# Patient Record
Sex: Female | Born: 1995 | Race: Black or African American | Hispanic: No | Marital: Married | Smoking: Never smoker
Health system: Southern US, Community
[De-identification: ages and names within clinical notes are randomized; demographics above are authoritative.]

## PROBLEM LIST (undated history)

## (undated) DIAGNOSIS — Z789 Other specified health status: Secondary | ICD-10-CM

## (undated) HISTORY — PX: NO PAST SURGERIES: SHX2092

---
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
## 2016-09-27 ENCOUNTER — Ambulatory Visit: Payer: Self-pay | Admitting: Obstetrics and Gynecology

## 2016-09-29 ENCOUNTER — Ambulatory Visit: Payer: Self-pay | Admitting: Obstetrics & Gynecology

## 2016-10-12 ENCOUNTER — Ambulatory Visit: Payer: Self-pay | Admitting: Obstetrics and Gynecology

## 2016-10-13 ENCOUNTER — Ambulatory Visit: Payer: Self-pay | Admitting: Obstetrics and Gynecology

## 2017-03-21 NOTE — L&D Delivery Note (Signed)
Operative Delivery Note At 11:12 PM a viable female was delivered via Vaginal, Vacuum Investment banker, operational(Extractor).  Presentation: vertex; Position: Left,, Occiput,, Anterior; Station: +3.  Verbal consent: obtained from patient.  Risks and benefits discussed in detail.  Risks include, but are not limited to the risks of anesthesia, bleeding, infection, damage to maternal tissues, fetal cephalhematoma.  There is also the risk of inability to effect vaginal delivery of the head, or shoulder dystocia that cannot be resolved by established maneuvers, leading to the need for emergency cesarean section.  Vacuum assistance offered due to maternal fatigue.  Initial presentation ROP, spontaneously rotated to LOA as vertex descended further.  Required pushing with 2 ctx with vacuum in green zone.  MLE done due to tight perineum restricting delivery.  APGAR: 7, 9; weight pending.   Placenta status: spontaneous, intact.   Cord:  with the following complications: none.    Anesthesia:  Episural and local Instruments: Kiwi cup Episiotomy: Median Lacerations: small periurethral Suture Repair: 3.0 vicryl rapide Est. Blood Loss (mL): 105  Mom to postpartum.  Baby to Couplet care / Skin to Skin.  Will do circumcision in the office.  Leighton Roachodd D Julaine Zimny 03/10/2018, 11:38 PM

## 2017-05-25 ENCOUNTER — Ambulatory Visit (INDEPENDENT_AMBULATORY_CARE_PROVIDER_SITE_OTHER): Payer: Self-pay | Admitting: Obstetrics and Gynecology

## 2017-05-25 ENCOUNTER — Encounter: Payer: Self-pay | Admitting: Obstetrics and Gynecology

## 2017-05-25 VITALS — BP 130/80 | HR 100 | Ht 66.0 in | Wt 163.0 lb

## 2017-05-25 DIAGNOSIS — Z3046 Encounter for surveillance of implantable subdermal contraceptive: Secondary | ICD-10-CM

## 2017-05-25 NOTE — Patient Instructions (Addendum)
I value your feedback and entrusting us with your care. If you get a [facility] patient survey, I would appreciate you taking the time to let us know about your experience today. Thank you!  Remove the dressing in 12-24 hours,  keep the incision area dry for 24 hours and remove the Steristrip in 2-3  days.  Notify us if any signs of tenderness, redness, pain, or fevers develop. 

## 2017-05-25 NOTE — Progress Notes (Signed)
   Chief Complaint  Patient presents with  . Contraception     History of Present Illness:  Betty Richardson is a 22 y.o. that had a nexplanon placed approximately 3 1/2 yrs ago. It has expired and is due for removal.   Nexplanon removal Procedure note - The Nexplanon was noted in the patient's arm and the end was identified. The skin was cleansed with a Betadine solution. A small injection of subcutaneous lidocaine with epinephrine was given over the end of the implant. An incision was made at the end of the implant. The rod was noted in the incision and grasped with a hemostat. It was noted to be intact.  Steri-Strip was placed approximating the incision. Hemostasis was noted.  Assessment: Nexplanon removal   Plan:   She was told to remove the dressing in 12-24 hours, to keep the incision area dry for 24 hours and to remove the Steristrip in 2-3  days.  Notify us if any signs of tenderness, redness, pain, or fevers develop.   Suman Trivedi B. Deniqua Perry, PA-C 05/25/2017 9:05 AM

## 2017-08-13 ENCOUNTER — Ambulatory Visit
Admission: EM | Admit: 2017-08-13 | Discharge: 2017-08-13 | Disposition: A | Discharge status: Home / Self Care | Payer: BLUE CROSS/BLUE SHIELD | Attending: Family Medicine | Admitting: Family Medicine

## 2017-08-13 ENCOUNTER — Other Ambulatory Visit: Payer: Self-pay

## 2017-08-13 DIAGNOSIS — J029 Acute pharyngitis, unspecified: Secondary | ICD-10-CM | POA: Diagnosis not present

## 2017-08-13 LAB — RAPID STREP SCREEN (MED CTR MEBANE ONLY): Streptococcus, Group A Screen (Direct): NEGATIVE

## 2017-08-13 MED ORDER — AMOXICILLIN 500 MG PO CAPS: 500.0000 mg | ORAL_CAPSULE | Freq: Two times a day (BID) | ORAL | 0 refills | Status: AC

## 2017-08-13 NOTE — ED Provider Notes (Signed)
[facility] ____________________________________________  Time seen: Approximately 1:59 PM  I have reviewed the triage vital signs and the nursing notes.   HISTORY  Chief Complaint Sore Throat   HPI Betty Richardson is a 22 y.o. female presenting for evaluation of sore throat for the last 2 to 3 days.  States sore throat is painful to swallow, and constant.  States sore throat is currently moderate.  States has had decreased p.o. intake due to sore throat and pain with swallowing, but does continue to drink fluids, has not eaten much in the last 2 days.  No vomiting or diarrhea.  Denies abdominal pain, vaginal discomfort, vaginal discharge, leaking or vaginal bleeding.  Patient denies cough or nasal drainage.  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  States when she is trying to clear the nasal congestion from back of her throat she does sometimes have blood present in the mucus, but states that this is only when clearing the mucus.  Denies any other atypical bleeding.  Denies coughing up blood.  Denies known fevers.  Has not been taken over-the-counter medications for the same complaints.  Denies known close sick contacts.  Reports otherwise feels well.  States she is approximately 2.5 months pregnant.  States has a follow-up with OB/GYN this coming Thursday for evaluation Denies chest pain, shortness of breath, abdominal pain, dysuria, or rash. Denies recent sickness. Denies recent antibiotic use.    History reviewed. No pertinent past medical history.  There are no active problems to display for this patient.   Past Surgical History:  Procedure Laterality Date  . NO PAST SURGERIES       No current facility-administered medications for this encounter.   Current Outpatient Medications:  .  Prenatal Vit-Fe Fumarate-FA (PRENATAL MULTIVITAMIN) TABS tablet,  Take 1 tablet by mouth daily at 12 noon., Disp: , Rfl:  .  amoxicillin (AMOXIL) 500 MG capsule, Take 1 capsule (500 mg total) by mouth 2 (two) times daily for 10 days., Disp: 20 capsule, Rfl: 0  Allergies Patient has no known allergies.  Family History  Problem Relation Age of Onset  . Heart failure Maternal Grandmother 70       type 2  . Heart failure Maternal Grandfather 60       type2  . Seizures Father     Social History Social History   Tobacco Use  . Smoking status: Never Smoker  . Smokeless tobacco: Never Used  Substance Use Topics  . Alcohol use: No  . Drug use: No    Review of Systems Constitutional: No fever ENT: positive sore throat. Cardiovascular: Denies chest pain. Respiratory: Denies shortness of breath. Gastrointestinal: No abdominal pain.  No nausea, no vomiting.  No diarrhea.   Genitourinary: Negative for dysuria. Musculoskeletal: Negative for back pain. Skin: Negative for rash.   ____________________________________________   PHYSICAL EXAM:  VITAL SIGNS: ED Triage Vitals  Enc Vitals Group     BP 08/13/17 1348 118/77     Pulse Rate 08/13/17 1348 86     Resp 08/13/17 1348 17     Temp 08/13/17 1348 98.6 F (37 C)     Temp Source 08/13/17 1348 Oral     SpO2 08/13/17 1348 100 %     Weight --      Height 08/13/17 1343  (1.702 m)     Head Circumference --  Peak Flow --      Pain Score 08/13/17 1343 10     Pain Loc --      Pain Edu? --      Excl. in GC? --    Constitutional: Alert and oriented. Well appearing and in no acute distress. Eyes: Conjunctivae are normal. Head: Atraumatic. No sinus tenderness to palpation. No swelling. No erythema.  Ears: no erythema, normal TMs bilaterally.   Nose:No nasal congestion.   Mouth/Throat: Mucous membranes are moist. Moderate pharyngeal erythema. Mild bilateral tonsillar swelling and scant bilateral exudate. Neck: No stridor.  No cervical spine tenderness to  palpation. Hematological/Lymphatic/Immunilogical: Mild anterior bilateral cervical lymphadenopathy. Cardiovascular: Normal rate, regular rhythm. Grossly normal heart sounds.  Good peripheral circulation. Respiratory: Normal respiratory effort.  No retractions. No wheezes, rales or rhonchi. Good air movement.  Gastrointestinal: Soft and nontender.  Musculoskeletal: Ambulatory with steady gait.  Neurologic:  Normal speech and language. No gait instability. Skin:  Skin appears warm, dry and intact. No rash noted. Psychiatric: Mood and affect are normal. Speech and behavior are normal.  ___________________________________________   LABS (all labs ordered are listed, but only abnormal results are displayed)  Labs Reviewed  RAPID STREP SCREEN ([facility] ONLY)  CULTURE, GROUP A STREP [facility])   PROCEDURES Procedures   INITIAL IMPRESSION / ASSESSMENT AND PLAN / ED COURSE  Pertinent labs & imaging results that were available during my care of the patient were reviewed by me and considered in my medical decision making (see chart for details).  Well-appearing patient.  No acute distress.  Presenting for evaluation of a sore throat.  Quick strep negative, will culture.  However suspicious for streptococcal pharyngitis.  Will empirically start on oral amoxicillin.  Over-the-counter Tylenol as needed,  Gargles and supportive care.discussed closely monitoring symptoms and sooner reevaluation for no improvement or worsening complaints.  Also discussed avoidance of trigger.Discussed indication, risks and benefits of medications with patient.  Discussed follow up with Primary care physician this week. Discussed follow up and return parameters including no resolution or any worsening concerns. Patient verbalized understanding and agreed to plan.   ____________________________________________   FINAL CLINICAL IMPRESSION(S) / ED DIAGNOSES  Final diagnoses:  Pharyngitis, unspecified etiology     ED  Discharge Orders        Ordered    amoxicillin (AMOXIL) 500 MG capsule  2 times daily     08/13/17 1417       Note: This dictation was prepared with Dragon dictation along with smaller phrase technology. Any transcriptional errors that result from this process are unintentional.         Renford Dills, NP 08/13/17 1443

## 2017-08-13 NOTE — ED Triage Notes (Signed)
Patient complains of sore throat that started a few days ago. Patient states that she is currently pregnant.

## 2017-08-13 NOTE — Discharge Instructions (Addendum)
Take medication as prescribed. Rest. Drink plenty of fluids.  ° °Follow up with your primary care physician this week as needed. Return to Urgent care for new or worsening concerns.  ° °

## 2017-08-15 ENCOUNTER — Telehealth (HOSPITAL_COMMUNITY): Payer: Self-pay

## 2017-08-15 LAB — CULTURE, GROUP A STREP (THRC)

## 2017-08-15 NOTE — Telephone Encounter (Signed)
Strep is positive, pt was treated with amoxicillin at urgent care visit. Pt called and made aware. Answered all questions.

## 2017-09-06 LAB — OB RESULTS CONSOLE RUBELLA ANTIBODY, IGM: Rubella: IMMUNE

## 2017-09-06 LAB — OB RESULTS CONSOLE HEPATITIS B SURFACE ANTIGEN: Hepatitis B Surface Ag: NEGATIVE

## 2017-12-13 LAB — OB RESULTS CONSOLE HIV ANTIBODY (ROUTINE TESTING): HIV: NONREACTIVE

## 2018-02-12 LAB — OB RESULTS CONSOLE GC/CHLAMYDIA
Chlamydia: NEGATIVE
Gonorrhea: NEGATIVE

## 2018-02-12 LAB — OB RESULTS CONSOLE GBS: GBS: NEGATIVE

## 2018-03-10 ENCOUNTER — Encounter (HOSPITAL_COMMUNITY): Payer: Self-pay

## 2018-03-10 ENCOUNTER — Inpatient Hospital Stay (HOSPITAL_COMMUNITY)
Admission: RE | Admit: 2018-03-10 | Discharge: 2018-03-12 | DRG: 807 | Disposition: A | Discharge status: Home / Self Care | Payer: Medicaid Other | Attending: Obstetrics and Gynecology | Admitting: Obstetrics and Gynecology

## 2018-03-10 ENCOUNTER — Inpatient Hospital Stay (HOSPITAL_COMMUNITY): Payer: Medicaid Other | Admitting: Anesthesiology

## 2018-03-10 DIAGNOSIS — Z3483 Encounter for supervision of other normal pregnancy, third trimester: Secondary | ICD-10-CM | POA: Diagnosis present

## 2018-03-10 DIAGNOSIS — Z3A40 40 weeks gestation of pregnancy: Secondary | ICD-10-CM | POA: Diagnosis not present

## 2018-03-10 DIAGNOSIS — O480 Post-term pregnancy: Secondary | ICD-10-CM | POA: Diagnosis present

## 2018-03-10 LAB — CBC
HCT: 38.8 % (ref 36.0–46.0)
HCT: 38 % (ref 36.0–46.0)
HEMOGLOBIN: 12.9 g/dL (ref 12.0–15.0)
Hemoglobin: 13.4 g/dL (ref 12.0–15.0)
MCH: 28.8 pg (ref 26.0–34.0)
MCH: 28.9 pg (ref 26.0–34.0)
MCHC: 34.5 g/dL (ref 30.0–36.0)
MCHC: 33.9 g/dL (ref 30.0–36.0)
MCV: 83.4 fL (ref 80.0–100.0)
MCV: 85 fL (ref 80.0–100.0)
Platelets: 116 10*3/uL — ABNORMAL LOW (ref 150–400)
Platelets: 102 10*3/uL — ABNORMAL LOW (ref 150–400)
RBC: 4.65 MIL/uL (ref 3.87–5.11)
RBC: 4.47 MIL/uL (ref 3.87–5.11)
RDW: 13.5 % (ref 11.5–15.5)
RDW: 13.7 % (ref 11.5–15.5)
WBC: 9.5 10*3/uL (ref 4.0–10.5)
WBC: 11.4 10*3/uL — ABNORMAL HIGH (ref 4.0–10.5)
nRBC: 0 % (ref 0.0–0.2)
nRBC: 0 % (ref 0.0–0.2)

## 2018-03-10 LAB — TYPE AND SCREEN
ABO/RH(D): A POS
Antibody Screen: NEGATIVE

## 2018-03-10 LAB — ABO/RH: ABO/RH(D): A POS

## 2018-03-10 LAB — RPR: RPR Ser Ql: NONREACTIVE

## 2018-03-10 MED ORDER — ONDANSETRON HCL 4 MG/2ML IJ SOLN: 4.0000 mg | Freq: Four times a day (QID) | INTRAMUSCULAR | Status: DC | PRN

## 2018-03-10 MED ORDER — OXYCODONE-ACETAMINOPHEN 5-325 MG PO TABS: 2.0000 | ORAL_TABLET | ORAL | Status: DC | PRN

## 2018-03-10 MED ORDER — BUTORPHANOL TARTRATE 1 MG/ML IJ SOLN
1.0000 mg | INTRAMUSCULAR | Status: DC | PRN
  Administered 2018-03-10 (×2): 1 mg via INTRAVENOUS
  Filled 2018-03-10 (×2): qty 1

## 2018-03-10 MED ORDER — LACTATED RINGERS IV SOLN
500.0000 mL | Freq: Once | INTRAVENOUS | Status: AC
  Administered 2018-03-10: 500 mL via INTRAVENOUS

## 2018-03-10 MED ORDER — LIDOCAINE HCL URETHRAL/MUCOSAL 2 % EX GEL
1.0000 "application " | CUTANEOUS | Status: DC | PRN
  Administered 2018-03-10: 1 via URETHRAL
  Filled 2018-03-10 (×2): qty 5

## 2018-03-10 MED ORDER — OXYTOCIN 40 UNITS IN LACTATED RINGERS INFUSION - SIMPLE MED
1.0000 m[IU]/min | INTRAVENOUS | Status: DC
  Administered 2018-03-10: 2 m[IU]/min via INTRAVENOUS
  Filled 2018-03-10: qty 1000

## 2018-03-10 MED ORDER — ACETAMINOPHEN 325 MG PO TABS: 650.0000 mg | ORAL_TABLET | ORAL | Status: DC | PRN

## 2018-03-10 MED ORDER — LACTATED RINGERS IV SOLN
INTRAVENOUS | Status: DC
  Administered 2018-03-10 (×2): via INTRAVENOUS

## 2018-03-10 MED ORDER — OXYCODONE-ACETAMINOPHEN 5-325 MG PO TABS: 1.0000 | ORAL_TABLET | ORAL | Status: DC | PRN

## 2018-03-10 MED ORDER — LIDOCAINE HCL (PF) 1 % IJ SOLN
30.0000 mL | INTRAMUSCULAR | Status: DC | PRN
  Administered 2018-03-10: 30 mL via SUBCUTANEOUS
  Filled 2018-03-10: qty 30

## 2018-03-10 MED ORDER — FENTANYL 2.5 MCG/ML BUPIVACAINE 1/10 % EPIDURAL INFUSION (WH - ANES)
14.0000 mL/h | INTRAMUSCULAR | Status: DC | PRN
  Administered 2018-03-10: 14 mL/h via EPIDURAL
  Filled 2018-03-10: qty 100

## 2018-03-10 MED ORDER — OXYTOCIN 40 UNITS IN LACTATED RINGERS INFUSION - SIMPLE MED: 2.5000 [IU]/h | INTRAVENOUS | Status: DC

## 2018-03-10 MED ORDER — LIDOCAINE HCL (PF) 1 % IJ SOLN
INTRAMUSCULAR | Status: DC | PRN
  Administered 2018-03-10 (×2): 5 mL via EPIDURAL

## 2018-03-10 MED ORDER — SOD CITRATE-CITRIC ACID 500-334 MG/5ML PO SOLN: 30.0000 mL | ORAL | Status: DC | PRN

## 2018-03-10 MED ORDER — PHENYLEPHRINE 40 MCG/ML (10ML) SYRINGE FOR IV PUSH (FOR BLOOD PRESSURE SUPPORT)
80.0000 ug | PREFILLED_SYRINGE | INTRAVENOUS | Status: DC | PRN
  Administered 2018-03-10: 80 ug via INTRAVENOUS
  Filled 2018-03-10: qty 10

## 2018-03-10 MED ORDER — EPHEDRINE 5 MG/ML INJ
10.0000 mg | INTRAVENOUS | Status: DC | PRN
  Filled 2018-03-10: qty 2

## 2018-03-10 MED ORDER — PHENYLEPHRINE 40 MCG/ML (10ML) SYRINGE FOR IV PUSH (FOR BLOOD PRESSURE SUPPORT)
80.0000 ug | PREFILLED_SYRINGE | INTRAVENOUS | Status: DC | PRN
  Filled 2018-03-10 (×2): qty 10

## 2018-03-10 MED ORDER — DIPHENHYDRAMINE HCL 50 MG/ML IJ SOLN: 12.5000 mg | INTRAMUSCULAR | Status: DC | PRN

## 2018-03-10 MED ORDER — OXYTOCIN 10 UNIT/ML IJ SOLN
INTRAMUSCULAR | Status: AC
  Filled 2018-03-10: qty 1

## 2018-03-10 MED ORDER — TERBUTALINE SULFATE 1 MG/ML IJ SOLN
0.2500 mg | Freq: Once | INTRAMUSCULAR | Status: DC | PRN
  Filled 2018-03-10: qty 1

## 2018-03-10 MED ORDER — OXYTOCIN BOLUS FROM INFUSION
500.0000 mL | Freq: Once | INTRAVENOUS | Status: AC
  Administered 2018-03-10: 500 mL via INTRAVENOUS

## 2018-03-10 MED ORDER — LACTATED RINGERS IV SOLN: 500.0000 mL | INTRAVENOUS | Status: DC | PRN

## 2018-03-10 NOTE — Anesthesia Preprocedure Evaluation (Addendum)
Anesthesia Evaluation  Patient identified by MRN, date of birth, ID band Patient awake    Reviewed: Allergy & Precautions, NPO status , Patient's Chart, lab work & pertinent test results  History of Anesthesia Complications Negative for: history of anesthetic complications  Airway Mallampati: IV  TM Distance: >3 FB Neck ROM: Full    Dental  (+) Dental Advisory Given   Pulmonary neg pulmonary ROS,    breath sounds clear to auscultation       Cardiovascular negative cardio ROS   Rhythm:Regular Rate:Normal     Neuro/Psych negative neurological ROS     GI/Hepatic Neg liver ROS, GERD  Poorly Controlled,  Endo/Other  obese  Renal/GU negative Renal ROS     Musculoskeletal   Abdominal (+) + obese,   Peds  Hematology plt 116k, repeat 102k   Anesthesia Other Findings   Reproductive/Obstetrics (+) Pregnancy                             Anesthesia Physical Anesthesia Plan  ASA: II  Anesthesia Plan: Epidural   Post-op Pain Management:    Induction:   PONV Risk Score and Plan: 2 and Treatment may vary due to age or medical condition  Airway Management Planned: Natural Airway  Additional Equipment:   Intra-op Plan:   Post-operative Plan:   Informed Consent: I have reviewed the patients History and Physical, chart, labs and discussed the procedure including the risks, benefits and alternatives for the proposed anesthesia with the patient or authorized representative who has indicated his/her understanding and acceptance.   Dental advisory given  Plan Discussed with:   Anesthesia Plan Comments: (Patient identified. Risks/Benefits/Options discussed with patient including but not limited to bleeding, infection, nerve damage, paralysis, failed block, incomplete pain control, headache, blood pressure changes, nausea, vomiting, reactions to medication both or allergic, itching and postpartum  back pain. Confirmed with bedside nurse the patient's most recent platelet count. Confirmed with patient that they are not currently taking any anticoagulation, have any bleeding history or any family history of bleeding disorders. Patient expressed understanding and wished to proceed. All questions were answered. )        Anesthesia Quick Evaluation

## 2018-03-10 NOTE — H&P (Signed)
Jodelle GrossShakira M Boswell is a 22 y.o. female, G1P0, [facility]redacted[facility] presenting for elective induction.  Prenatal care essentially uncomplicated.  OB History    Gravida  1   Para  0   Term  0   Preterm  0   AB  0   Living  0     SAB  0   TAB  0   Ectopic  0   Multiple  0   Live Births  0          History reviewed. No pertinent past medical history. Past Surgical History:  Procedure Laterality Date  . NO PAST SURGERIES     Family History: family history includes Heart failure (age of onset: 3960) in her maternal grandfather; Heart failure (age of onset: 8070) in her maternal grandmother; Seizures in her father. Social History:  reports that she has never smoked. She has never used smokeless tobacco. She reports that she does not drink alcohol or use drugs.     Maternal Diabetes: No Genetic Screening: Declined Maternal Ultrasounds/Referrals: Normal Fetal Ultrasounds or other Referrals:  None Maternal Substance Abuse:  No Significant Maternal Medications:  None Significant Maternal Lab Results:  Lab values include: Group B Strep negative Other Comments:  None  Review of Systems  Respiratory: Negative.   Cardiovascular: Negative.    Maternal Medical History:  Fetal activity: Perceived fetal activity is normal.    Prenatal complications: no prenatal complications Prenatal Complications - Diabetes: none.    Dilation: 1.5 Effacement (%): 50 Station: -2 Exam by:: Sandy SalaamLynnsey Johnson, RN Blood pressure 129/73, pulse 97, temperature 98.2 F (36.8 C), temperature source Oral, resp. rate 18, height 5\' 7"  (1.702 m), weight 108.2 kg, last menstrual period 05/25/2017. Maternal Exam:  Uterine Assessment: Contraction strength is mild.  Contraction frequency is rare.   Abdomen: Patient reports no abdominal tenderness. Estimated fetal weight is 7 1/2 lbs.   Fetal presentation: vertex  Introitus: Normal vagina.  Amniotic fluid character: not assessed.  Pelvis:  adequate for delivery.   Cervix: Cervix evaluated by digital exam.     Fetal Exam Fetal Monitor Review: Mode: ultrasound.   Baseline rate: 130.  Variability: moderate (6-25 bpm).   Pattern: accelerations present and no decelerations.    Fetal State Assessment: Category I - tracings are normal.     Physical Exam  Vitals reviewed. Constitutional: She appears well-developed and well-nourished.  Cardiovascular: Normal rate and regular rhythm.  Respiratory: Effort normal. No respiratory distress.  GI: Soft.    Prenatal labs: ABO, Rh: --/--/A POS (12/21 0757) Antibody: NEG (12/21 0757) Rubella:  Immune RPR:   NR HBsAg:  Neg  HIV:   NR GBS:   Neg  Assessment/Plan: IUP at 40+ weeks for elective induction.  Will start pitocin, monitor progress, anticipate SVD.   Leighton Roachodd D Shameka Aggarwal 03/10/2018, 9:43 AM

## 2018-03-10 NOTE — Progress Notes (Signed)
Starting to feel ctx Afeb, VSS, BP mostly normal, a bit labile FHT- 130s, Cat I VE-2-3/70/-1 to -2, vtx, AROM clear Continue pitocin, monitor progress

## 2018-03-10 NOTE — Anesthesia Pain Management Evaluation Note (Signed)
  [facility] Pain Management Visit Note  Patient: Jodelle GrossShakira M Boswell, 22 y.o., female  "Hello [facility]. We have an anesthesia team available at all times to provide care throughout the hospital, including epidural management and anesthesia for C-section. I don't know your plan for the delivery whether it a natural birth, water birth, IV sedation, nitrous supplementation, doula or epidural, but we want to meet your pain goals."   1.Was your pain managed to your expectations on prior hospitalizations?   No prior hospitalizations  2.What is your expectation for pain management during this hospitalization?     Epidural  3.How can we help you reach that goal?   Record the patient's initial score and the patient's pain goal.   Pain: 0  Pain Goal: 7 [facility] wants you to be able to say your pain was always managed very well.  Laban EmperorMalinova,Loris Seelye Hristova 03/10/2018

## 2018-03-10 NOTE — Anesthesia Procedure Notes (Addendum)
Epidural Patient location during procedure: OB Start time: 03/10/2018 4:21 PM End time: 03/10/2018 4:43 PM  Staffing Anesthesiologist: Jairo BenJackson, Trey Gulbranson, MD Performed: anesthesiologist   Preanesthetic Checklist Completed: patient identified, surgical consent, pre-op evaluation, timeout performed, IV checked, risks and benefits discussed and monitors and equipment checked  Epidural Patient position: sitting Prep: site prepped and draped and DuraPrep Patient monitoring: blood pressure, continuous pulse ox and heart rate Approach: midline Location: L2-L3 Injection technique: LOR air  Needle:  Needle type: Tuohy  Needle gauge: 17 G Needle length: 9 cm Needle insertion depth: 6 cm Catheter type: closed end flexible Catheter size: 19 Gauge Catheter at skin depth: 11 cm Test dose: negative (1% lidocaine)  Assessment Events: blood not aspirated, injection not painful, no injection resistance, negative IV test and no paresthesia  Additional Notes Pt identified in Labor room.  Monitors applied. Working IV access confirmed. Sterile prep, drape lumbar spine.  1% lido local L 2,3.  #17ga Touhy LOR air at 6 cm L 2,3, cath in easily to 11 cm skin. Test dose OK, cath dosed and infusion begun.  Patient asymptomatic, VSS, no heme aspirated, tolerated well.  Sandford Craze Yeiren Whitecotton, MDReason for block:procedure for pain

## 2018-03-11 LAB — CBC
HCT: 36.6 % (ref 36.0–46.0)
Hemoglobin: 12.5 g/dL (ref 12.0–15.0)
MCH: 28.8 pg (ref 26.0–34.0)
MCHC: 34.2 g/dL (ref 30.0–36.0)
MCV: 84.3 fL (ref 80.0–100.0)
NRBC: 0 % (ref 0.0–0.2)
Platelets: 127 10*3/uL — ABNORMAL LOW (ref 150–400)
RBC: 4.34 MIL/uL (ref 3.87–5.11)
RDW: 13.6 % (ref 11.5–15.5)
WBC: 11.1 10*3/uL — ABNORMAL HIGH (ref 4.0–10.5)

## 2018-03-11 MED ORDER — IBUPROFEN 600 MG PO TABS
600.0000 mg | ORAL_TABLET | Freq: Four times a day (QID) | ORAL | Status: DC
  Administered 2018-03-11 – 2018-03-12 (×4): 600 mg via ORAL
  Filled 2018-03-11 (×4): qty 1

## 2018-03-11 MED ORDER — ZOLPIDEM TARTRATE 5 MG PO TABS: 5.0000 mg | ORAL_TABLET | Freq: Every evening | ORAL | Status: DC | PRN

## 2018-03-11 MED ORDER — SENNOSIDES-DOCUSATE SODIUM 8.6-50 MG PO TABS
2.0000 | ORAL_TABLET | ORAL | Status: DC
  Administered 2018-03-11 – 2018-03-12 (×2): 2 via ORAL
  Filled 2018-03-11 (×2): qty 2

## 2018-03-11 MED ORDER — DIBUCAINE 1 % RE OINT: 1.0000 "application " | TOPICAL_OINTMENT | RECTAL | Status: DC | PRN

## 2018-03-11 MED ORDER — METHYLERGONOVINE MALEATE 0.2 MG/ML IJ SOLN: 0.2000 mg | INTRAMUSCULAR | Status: DC | PRN

## 2018-03-11 MED ORDER — OXYCODONE HCL 5 MG PO TABS: 10.0000 mg | ORAL_TABLET | ORAL | Status: DC | PRN

## 2018-03-11 MED ORDER — ACETAMINOPHEN 325 MG PO TABS
650.0000 mg | ORAL_TABLET | ORAL | Status: DC | PRN
  Administered 2018-03-11: 650 mg via ORAL
  Filled 2018-03-11: qty 2

## 2018-03-11 MED ORDER — DIPHENHYDRAMINE HCL 25 MG PO CAPS: 25.0000 mg | ORAL_CAPSULE | Freq: Four times a day (QID) | ORAL | Status: DC | PRN

## 2018-03-11 MED ORDER — SIMETHICONE 80 MG PO CHEW: 80.0000 mg | CHEWABLE_TABLET | ORAL | Status: DC | PRN

## 2018-03-11 MED ORDER — METHYLERGONOVINE MALEATE 0.2 MG PO TABS: 0.2000 mg | ORAL_TABLET | ORAL | Status: DC | PRN

## 2018-03-11 MED ORDER — MAGNESIUM HYDROXIDE 400 MG/5ML PO SUSP: 30.0000 mL | ORAL | Status: DC | PRN

## 2018-03-11 MED ORDER — PRENATAL MULTIVITAMIN CH
1.0000 | ORAL_TABLET | Freq: Every day | ORAL | Status: DC
  Administered 2018-03-11: 1 via ORAL

## 2018-03-11 MED ORDER — TETANUS-DIPHTH-ACELL PERTUSSIS 5-2.5-18.5 LF-MCG/0.5 IM SUSP: 0.5000 mL | Freq: Once | INTRAMUSCULAR | Status: DC

## 2018-03-11 MED ORDER — COCONUT OIL OIL: 1.0000 "application " | TOPICAL_OIL | Status: DC | PRN

## 2018-03-11 MED ORDER — ONDANSETRON HCL 4 MG PO TABS: 4.0000 mg | ORAL_TABLET | ORAL | Status: DC | PRN

## 2018-03-11 MED ORDER — BENZOCAINE-MENTHOL 20-0.5 % EX AERO: 1.0000 "application " | INHALATION_SPRAY | CUTANEOUS | Status: DC | PRN

## 2018-03-11 MED ORDER — OXYCODONE HCL 5 MG PO TABS: 5.0000 mg | ORAL_TABLET | ORAL | Status: DC | PRN

## 2018-03-11 MED ORDER — WITCH HAZEL-GLYCERIN EX PADS: 1.0000 "application " | MEDICATED_PAD | CUTANEOUS | Status: DC | PRN

## 2018-03-11 MED ORDER — MEASLES, MUMPS & RUBELLA VAC IJ SOLR: 0.5000 mL | Freq: Once | INTRAMUSCULAR | Status: DC

## 2018-03-11 MED ORDER — ONDANSETRON HCL 4 MG/2ML IJ SOLN: 4.0000 mg | INTRAMUSCULAR | Status: DC | PRN

## 2018-03-11 NOTE — Anesthesia Postprocedure Evaluation (Signed)
Anesthesia Post Note  Patient: Betty Richardson  Procedure(s) Performed: AN AD HOC LABOR EPIDURAL     Patient location during evaluation: Mother Baby Anesthesia Type: Epidural Level of consciousness: awake and alert Pain management: pain level controlled Vital Signs Assessment: post-procedure vital signs reviewed and stable Respiratory status: spontaneous breathing, nonlabored ventilation and respiratory function stable Cardiovascular status: stable Postop Assessment: no headache, no backache, epidural receding, no apparent nausea or vomiting, patient able to bend at knees, able to ambulate and adequate PO intake Anesthetic complications: no    Last Vitals:  Vitals:   03/11/18 0212 03/11/18 0636  BP: (!) 126/58 129/72  Pulse: (!) 114 (!) 102  Resp: 18 18  Temp: 36.4 C 37.2 C  SpO2:      Last Pain:  Vitals:   03/11/18 0745  TempSrc:   PainSc: (P) 2    Pain Goal:                 Betty Richardson

## 2018-03-11 NOTE — Progress Notes (Signed)
PPD #1 No problems Afeb, VSS Fundus firm, NT at U-1 Continue routine postpartum care 

## 2018-03-11 NOTE — Lactation Note (Signed)
This note was copied from a baby's chart. Lactation Consultation Note  Patient Name: Betty Abigail MiyamotoShakira Richardson Betty Richardson Reason for consult: Initial assessment;1st time breastfeeding;Term P1, 8 hour female infant. BF concerns: infant is spitty with emesis and reluctant to latch at this time, mom has short shaft nipples with large breast.  Per mom, infant did not latch in L&D and she made few attempts. LC assisted mom in latching infant to breast but infant was reluctant to latch at this time and was spitty with emesis. Mom hand expressed 13 ml of colostrum and infant took 5 ml of colostrum by spoon. Mom has short shaft nipples and was fitted with 20 mm NS the 24 NS was to large and infant attempted to suckle before becoming spitty. LC discussed I & O. Mom shown how to use DEBP & how to disassemble, clean, & reassemble parts. Mom made aware of O/P services, breastfeeding support groups, community resources, and our phone # for post-discharge questions.  BF plans: 1. Mom will Bf according hunger cues and not exceed 3 hours without BF infant. 2. Mom will wear breast shells in bra during the day and will not sleep in shells at night. 3. Mom will attempt to latch infant to breast with 20 mm NS. 4. Mom will give back infant any pumped EBM.  5. Mom will ask for assistance  with latching infant to breast from Nurse or LC at next feeding.  Maternal Data Formula Feeding for Exclusion: Yes Reason for exclusion: Mother's choice to formula and breast feed on admission Has patient been taught Hand Expression?: Yes(Mom hand expressed 13 ml of colostrum, infant was spoon feed 5 ml of colostrum.) Does the patient have breastfeeding experience prior to this delivery?: No  Feeding Feeding Type: Breast Fed  LATCH Score Latch: Repeated attempts needed to sustain latch, nipple held in mouth throughout feeding, stimulation needed to elicit sucking reflex.  Audible Swallowing: None  Type of Nipple:  Everted at rest and after stimulation(short shafted and semi -flat )  Comfort (Breast/Nipple): Soft / non-tender  Hold (Positioning): Assistance needed to correctly position infant at breast and maintain latch.  LATCH Score: 6  Interventions Interventions: Breast feeding basics reviewed;Assisted with latch;Skin to skin;Breast compression;Breast massage;Adjust position;Shells;Hand express;Support pillows;Position options;DEBP  Lactation Tools Discussed/Used Tools: Shells;Pump;Nipple Shields Nipple shield size: 20;24 Shell Type: (flat and short shafted ) WIC Program: Yes Pump Review: Setup, frequency, and cleaning;Milk Storage;Other (comment) Initiated by:: Betty Richardson, Betty Richardson Date initiated:: 03/11/18   Consult Status Consult Status: Follow-up Date: 03/11/18 Follow-up type: In-patient    Betty Richardson Richardson, 7:36 AM

## 2018-03-12 ENCOUNTER — Encounter (HOSPITAL_COMMUNITY): Payer: Self-pay | Admitting: *Deleted

## 2018-03-12 MED ORDER — IBUPROFEN 600 MG PO TABS: 600.0000 mg | ORAL_TABLET | Freq: Four times a day (QID) | ORAL | 0 refills | Status: DC

## 2018-03-12 MED ORDER — ACETAMINOPHEN 325 MG PO TABS: 650.0000 mg | ORAL_TABLET | ORAL | 0 refills | Status: DC | PRN

## 2018-03-12 NOTE — Progress Notes (Signed)
Post Partum Day 2 Subjective: no complaints, up ad lib and tolerating PO  Objective: Blood pressure 105/63, pulse 82, temperature 98.1 F (36.7 C), temperature source Oral, resp. rate 16, height 5\' 7"  (1.702 m), weight 108.2 kg, last menstrual period 05/25/2017, SpO2 100 %.  Physical Exam:  General: alert and cooperative Lochia: appropriate Uterine Fundus: firm   Recent Labs    03/10/18 1604 03/11/18 0002  HGB 12.9 12.5  HCT 38.0 36.6    Assessment/Plan: Discharge home   LOS: 2 days   Betty Richardson 03/12/2018, 9:35 AM

## 2018-03-12 NOTE — Lactation Note (Signed)
This note was copied from a baby's chart. Lactation Consultation Note  Patient Name: Boy Abigail MiyamotoShakira Boswell ZOXWR'UToday's Date: 03/12/2018 Reason for consult: Follow-up assessment;1st time breastfeeding;Primapara;Term;Infant weight loss(5% weight loss )  Baby is 40 hours old  LC reviewed and updated the doc flow sheets per mom and dad. LC confirmed with mom and dad the last wet was at 12/22 at 1305.  LC discussed the indicator on the diaper, and the location on the diaper  Where boys wet. Mom and dad felt they probably missed wet diapers.  L C reviewed breast feeding basics with consult.  Baby showing feeding cues after Dr. Ezequiel EssexGable checked the baby.  Mom mentioned she has been doing the cradle position.  LC recommended to ensure the baby learns depth with the latch and until the  Baby develops stronger muscles in is neck to use the cross cradle to latch and then  Switch arms to the cradle.  LC guided mom through the cross cradle and showed her how deep the baby should be when  Latched and showed dad how he can assist mom to obtained the depth. Multiple swallows noted and increased  With breast compressions. LC hand mom return demo with compressions.  Baby fed for 30 mins . Per mom comfortable , denied sore nipples and breast are heavier.  Sore nipple and engorgement prevention and tx reviewed .  LC instructed mom on the use of the hand pump, storage of breast milk, Breast feeding inform in the Mother and  Baby care booklet.  Discussed nutritive vs non - nutritive feeding patterns and the importance of watching baby for hanging out latched.  Importance of STS feedings until the baby is back to birth weight and gaining steadily , and can stay awake for most of  The feeding.  Mother informed of post-discharge support and given phone number to the lactation department, including services for phone call assistance; out-patient appointments; and breastfeeding support group. List of other breastfeeding  resources in the community given in the handout. Encouraged mother to call for problems or concerns related to breastfeeding.  Both mom and dad expressed understanding of teaching and expressed appreciation for assistance.  Per mom has not been using the NS to latch.    Maternal Data    Feeding Feeding Type: Breast Fed  LATCH Score Latch: Grasps breast easily, tongue down, lips flanged, rhythmical sucking.  Audible Swallowing: Spontaneous and intermittent  Type of Nipple: Everted at rest and after stimulation  Comfort (Breast/Nipple): Filling, red/small blisters or bruises, mild/mod discomfort  Hold (Positioning): Assistance needed to correctly position infant at breast and maintain latch.  LATCH Score: 8  Interventions Interventions: Breast feeding basics reviewed;Assisted with latch;Skin to skin;Hand express;Breast compression;Adjust position;Support pillows;Expressed milk;Shells;Hand pump;DEBP  Lactation Tools Discussed/Used Tools: Shells;Pump Shell Type: Inverted Breast pump type: Double-Electric Breast Pump;Manual WIC Program: Yes Pump Review: Setup, frequency, and cleaning;Milk Storage Initiated by:: MAI  Date initiated:: 03/12/18   Consult Status Consult Status: Complete Date: 03/12/18    Matilde SprangMargaret Ann Brooklynne Pereida 03/12/2018, 11:00 AM

## 2018-03-12 NOTE — Discharge Summary (Addendum)
OB Discharge Summary     Patient Name: Betty Richardson DOB: 01-19-1996 MRN: [id number]  Date of admission: 03/10/2018 Delivering MD: Jackelyn KnifeMEISINGER, TODD   Date of discharge: 03/12/2018  Admitting diagnosis: 40 WKS 2 DAYS INDUCTION Intrauterine pregnancy: 5227w4d     Secondary diagnosis:  Active Problems:   Indication for care in labor or delivery   SVD (spontaneous vaginal delivery)  Additional problems: none     Discharge diagnosis: Term Pregnancy Delivered                                                                                                Post partum procedures:none  Augmentation: AROM and Pitocin  Complications: None  Hospital course:  Induction of Labor With Vaginal Delivery   22 y.o. yo G1P0000 at 4127w4d was admitted to the hospital 03/10/2018 for induction of labor.  Indication for induction: Favorable cervix at term.  Patient had an uncomplicated labor course as follows: Membrane Rupture Time/Date: 1:05 PM ,03/10/2018   Intrapartum Procedures: Episiotomy: Median [2]                                         Lacerations:  None [1]  Patient had delivery of a Viable infant.  Information for the patient's newborn:  Omer JackBoswell, Boy Emmaleah [[id number]][[id number]]  Delivery Method: Vaginal, Vacuum (Extractor)(Filed from Delivery Summary)   03/10/2018  Details of delivery can be found in separate delivery note.  Patient had a routine postpartum course. Patient is discharged home 03/12/18.  Physical exam  Vitals:   03/11/18 1030 03/11/18 1600 03/11/18 2013 03/12/18 0511  BP: 128/72 (!) 125/98 131/80 105/63  Pulse: 94 99 84 82  Resp: 18 18 18 16   Temp: 98.8 F (37.1 C)  (!) 97.4 F (36.3 C) 98.1 F (36.7 C)  TempSrc: Oral  Oral Oral  SpO2: 98% 99% 100% 100%  Weight:      Height:       General: alert and cooperative Lochia: appropriate Uterine Fundus: firm  Labs: Lab Results  Component Value Date   WBC 11.1 (H) 03/11/2018   HGB 12.5 03/11/2018   HCT 36.6  03/11/2018   MCV 84.3 03/11/2018   PLT 127 (L) 03/11/2018   No flowsheet data found.  Discharge instruction: per After Visit Summary and "Baby and Me Booklet".  After visit meds:  Allergies as of 03/12/2018   No Known Allergies     Medication List    TAKE these medications   acetaminophen 325 MG tablet Commonly known as:  TYLENOL Take 2 tablets (650 mg total) by mouth every 4 (four) hours as needed (for pain scale < 4).   ibuprofen 600 MG tablet Commonly known as:  ADVIL,MOTRIN Take 1 tablet (600 mg total) by mouth every 6 (six) hours.   prenatal multivitamin Tabs tablet Take 1 tablet by mouth daily at 12 noon.       Diet: routine diet  Activity: Advance as tolerated. Pelvic rest for 6 weeks.   Outpatient follow up:6  weeks Follow up Appt:No future appointments. Follow up Visit:No follow-ups on file.  Postpartum contraception: Nexplanon  Newborn Data: Live born female  Birth Weight: 7 lb 12 oz (3515 g) APGAR: 7, 9  Newborn Delivery   Birth date/time:  03/10/2018 23:12:12 Delivery type:  Vaginal, Vacuum (Extractor)     Baby Feeding: Breast Disposition:home with mother   03/12/2018 Oliver PilaKathy W Morris Markham, MD

## 2018-10-27 ENCOUNTER — Encounter (HOSPITAL_COMMUNITY): Payer: Self-pay | Admitting: Family Medicine

## 2018-10-27 ENCOUNTER — Ambulatory Visit (HOSPITAL_COMMUNITY)
Admission: EM | Admit: 2018-10-27 | Discharge: 2018-10-27 | Disposition: A | Discharge status: Home / Self Care | Payer: Medicaid Other | Attending: Family Medicine | Admitting: Family Medicine

## 2018-10-27 ENCOUNTER — Other Ambulatory Visit: Payer: Self-pay

## 2018-10-27 DIAGNOSIS — W450XXA Nail entering through skin, initial encounter: Secondary | ICD-10-CM

## 2018-10-27 DIAGNOSIS — S91332A Puncture wound without foreign body, left foot, initial encounter: Secondary | ICD-10-CM

## 2018-10-27 DIAGNOSIS — Z23 Encounter for immunization: Secondary | ICD-10-CM

## 2018-10-27 MED ORDER — DOXYCYCLINE HYCLATE 100 MG PO TABS: 100.0000 mg | ORAL_TABLET | Freq: Two times a day (BID) | ORAL | 0 refills | Status: DC

## 2018-10-27 MED ORDER — TETANUS-DIPHTH-ACELL PERTUSSIS 5-2.5-18.5 LF-MCG/0.5 IM SUSP
0.5000 mL | Freq: Once | INTRAMUSCULAR | Status: AC
  Administered 2018-10-27: 0.5 mL via INTRAMUSCULAR

## 2018-10-27 MED ORDER — TETANUS-DIPHTH-ACELL PERTUSSIS 5-2.5-18.5 LF-MCG/0.5 IM SUSP
INTRAMUSCULAR | Status: AC
  Filled 2018-10-27: qty 0.5

## 2018-10-27 NOTE — ED Triage Notes (Signed)
Pt present she step on a rusty nail last night with her left foot. Pt states it hurts when she apply pressure to the bottom of her foot.  Pt is not update to on her tetanus.

## 2018-10-27 NOTE — ED Provider Notes (Signed)
[facility]    CSN: [id number] Arrival date & time: 10/27/18  1032     History   Chief Complaint Chief Complaint  Patient presents with  . Foot Pain    HPI Betty Richardson is a 23 y.o. female.   Initial visit for this 23 yo woman    Pt stepped on a rusty nail last night with her left foot. Pt states it hurts when she apply pressure to the bottom of her foot.  Pt is not update to on her tetanus.   Patient did go to college 5 years ago and may have rec'd Td then.  Patient delivers for [employer]     History reviewed. No pertinent past medical history.  Patient Active Problem List   Diagnosis Date Noted  . Indication for care in labor or delivery 03/10/2018  . SVD (spontaneous vaginal delivery) 03/10/2018    Past Surgical History:  Procedure Laterality Date  . NO PAST SURGERIES      OB History    Gravida  1   Para  0   Term  0   Preterm  0   AB  0   Living  0     SAB  0   TAB  0   Ectopic  0   Multiple  0   Live Births  0            Home Medications    Prior to Admission medications   Medication Sig Start Date End Date Taking? Authorizing Provider  acetaminophen (TYLENOL) 325 MG tablet Take 2 tablets (650 mg total) by mouth every 4 (four) hours as needed (for pain scale < 4). 03/12/18   Huel Coteichardson, Kathy, MD  doxycycline (VIBRA-TABS) 100 MG tablet Take 1 tablet (100 mg total) by mouth 2 (two) times daily. 10/27/18   Elvina SidleLauenstein, Veronika Heard, MD  ibuprofen (ADVIL,MOTRIN) 600 MG tablet Take 1 tablet (600 mg total) by mouth every 6 (six) hours. 03/12/18   Huel Coteichardson, Kathy, MD  Prenatal Vit-Fe Fumarate-FA (PRENATAL MULTIVITAMIN) TABS tablet Take 1 tablet by mouth daily at 12 noon.    [provider]    Family History Family History  Problem Relation Age of Onset  . Heart failure Maternal Grandmother 70       type 2  . Heart failure Maternal Grandfather 60       type2  . Seizures Father     Social History Social History    Tobacco Use  . Smoking status: Never Smoker  . Smokeless tobacco: Never Used  Substance Use Topics  . Alcohol use: No  . Drug use: No     Allergies   Patient has no known allergies.   Review of Systems Review of Systems   Physical Exam Triage Vital Signs ED Triage Vitals  Enc Vitals Group     BP 10/27/18 1102 134/86     Pulse Rate 10/27/18 1102 77     Resp 10/27/18 1102 16     Temp 10/27/18 1102 98.4 F (36.9 C)     Temp Source 10/27/18 1102 Oral     SpO2 10/27/18 1102 99 %     Weight --      Height --      Head Circumference --      Peak Flow --      Pain Score 10/27/18 1103 10     Pain Loc --      Pain Edu? --      Excl. in  GC? --    No data found.  Updated Vital Signs BP 134/86 (BP Location: Right Arm)   Pulse 77   Temp 98.4 F (36.9 C) (Oral)   Resp 16   LMP 10/16/2018   SpO2 99%   Physical Exam Vitals signs and nursing note reviewed.  Constitutional:      Appearance: Normal appearance.  Eyes:     Conjunctiva/sclera: Conjunctivae normal.  Pulmonary:     Effort: Pulmonary effort is normal.  Musculoskeletal: Normal range of motion.  Skin:    General: Skin is warm and dry.  Neurological:     General: No focal deficit present.     Mental Status: She is alert.  Psychiatric:        Mood and Affect: Mood normal.        UC Treatments / Results  Labs (all labs ordered are listed, but only abnormal results are displayed) Labs Reviewed - No data to display  EKG   Radiology No results found.  Procedures Procedures (including critical care time)  Medications Ordered in UC Medications  Tdap (BOOSTRIX) injection 0.5 mL (has no administration in time range)    Initial Impression / Assessment and Plan / UC Course  I have reviewed the triage vital signs and the nursing notes.  Pertinent labs & imaging results that were available during my care of the patient were reviewed by me and considered in my medical decision making (see chart for  details).    Final Clinical Impressions(s) / UC Diagnoses   Final diagnoses:  Puncture wound of left foot, initial encounter   Discharge Instructions   None    ED Prescriptions    Medication Sig Dispense Auth. Provider   doxycycline (VIBRA-TABS) 100 MG tablet Take 1 tablet (100 mg total) by mouth 2 (two) times daily. 20 tablet Robyn Haber, MD     Controlled Substance Prescriptions [place] Controlled Substance Registry consulted? Not Applicable   Robyn Haber, MD 10/27/18 1133

## 2019-03-28 LAB — OB RESULTS CONSOLE ABO/RH: RH Type: POSITIVE

## 2019-03-28 LAB — OB RESULTS CONSOLE RUBELLA ANTIBODY, IGM: Rubella: IMMUNE

## 2019-03-28 LAB — OB RESULTS CONSOLE HIV ANTIBODY (ROUTINE TESTING): HIV: NONREACTIVE

## 2019-03-28 LAB — OB RESULTS CONSOLE GC/CHLAMYDIA
Chlamydia: NEGATIVE
Gonorrhea: NEGATIVE

## 2019-03-28 LAB — OB RESULTS CONSOLE RPR: RPR: NONREACTIVE

## 2019-03-28 LAB — OB RESULTS CONSOLE HEPATITIS B SURFACE ANTIGEN: Hepatitis B Surface Ag: NEGATIVE

## 2019-03-28 LAB — OB RESULTS CONSOLE ANTIBODY SCREEN: Antibody Screen: NEGATIVE

## 2019-04-20 ENCOUNTER — Encounter: Payer: Self-pay | Admitting: Emergency Medicine

## 2019-04-20 ENCOUNTER — Other Ambulatory Visit: Payer: Self-pay

## 2019-04-20 ENCOUNTER — Ambulatory Visit
Admission: EM | Admit: 2019-04-20 | Discharge: 2019-04-20 | Disposition: A | Discharge status: Home / Self Care | Payer: BC Managed Care – PPO | Attending: Emergency Medicine | Admitting: Emergency Medicine

## 2019-04-20 DIAGNOSIS — Z20822 Contact with and (suspected) exposure to covid-19: Secondary | ICD-10-CM

## 2019-04-20 DIAGNOSIS — J3489 Other specified disorders of nose and nasal sinuses: Secondary | ICD-10-CM

## 2019-04-20 DIAGNOSIS — Z3A14 14 weeks gestation of pregnancy: Secondary | ICD-10-CM

## 2019-04-20 MED ORDER — LORATADINE 10 MG PO TABS: 10.0000 mg | ORAL_TABLET | Freq: Every day | ORAL | 0 refills | Status: DC

## 2019-04-20 MED ORDER — FLUTICASONE PROPIONATE 50 MCG/ACT NA SUSP: 1.0000 | Freq: Every day | NASAL | 0 refills | Status: DC

## 2019-04-20 NOTE — Discharge Instructions (Signed)
Take allergy med and flonase daily. Keep follow up w/ OB.  Your COVID test is pending - it is important to quarantine / isolate at home until your results are back. If you test positive and would like further evaluation for persistent or worsening symptoms, you may schedule an E-visit or virtual (video) visit throughout the [facility] app or website.  PLEASE NOTE: If you develop severe chest pain or shortness of breath please go to the ER or call 9-1-1 for further evaluation --> DO NOT schedule electronic or virtual visits for this. Please call our office for further guidance / recommendations as needed.  For information about the Covid vaccine, please visit [email or web address]

## 2019-04-20 NOTE — ED Provider Notes (Signed)
[facility]    CSN: [id number] Arrival date & time: 04/20/19  0854      History   Chief Complaint Chief Complaint  Patient presents with  . Headache    HPI Betty Richardson is a 24 y.o. female who reports pain [redacted] weeks gestation presenting for 3 to 5-day course of intermittent frontal headaches.  States pain is behind both eyes, nasal bridge.  Pain comes and goes, pressure-like sensation without radiation.  No change in vision, fever, tinnitus, ear pain, rhinorrhea, nasal congestion, throat pain.  No chest pain, difficulty breathing, cough, Covid exposure.  Patient has tried a total of 2 doses of Tylenol throughout course with some relief, though states some nasal strips that she wore last night helped more..  Patient last underwent Covid testing in December 2020 through her employer: Negative.     History reviewed. No pertinent past medical history.  Patient Active Problem List   Diagnosis Date Noted  . Indication for care in labor or delivery 03/10/2018  . SVD (spontaneous vaginal delivery) 03/10/2018    Past Surgical History:  Procedure Laterality Date  . NO PAST SURGERIES      OB History    Gravida  2   Para  0   Term  0   Preterm  0   AB  0   Living  0     SAB  0   TAB  0   Ectopic  0   Multiple  0   Live Births  0            Home Medications    Prior to Admission medications   Medication Sig Start Date End Date Taking? Authorizing Provider  acetaminophen (TYLENOL) 325 MG tablet Take 2 tablets (650 mg total) by mouth every 4 (four) hours as needed (for pain scale < 4). 03/12/18   Huel Cote, MD  fluticasone [facility]) 50 MCG/ACT nasal spray Place 1 spray into both nostrils daily. 04/20/19   Hall-Potvin, [place], PA-C  loratadine (CLARITIN) 10 MG tablet Take 1 tablet (10 mg total) by mouth daily. 04/20/19   Hall-Potvin, [place], PA-C  Prenatal Vit-Fe Fumarate-FA (PRENATAL MULTIVITAMIN) TABS tablet Take 1 tablet by mouth  daily at 12 noon.    [provider]    Family History Family History  Problem Relation Age of Onset  . Heart failure Maternal Grandmother 70       type 2  . Heart failure Maternal Grandfather 60       type2  . Seizures Father     Social History Social History   Tobacco Use  . Smoking status: Never Smoker  . Smokeless tobacco: Never Used  Substance Use Topics  . Alcohol use: No  . Drug use: No     Allergies   Patient has no known allergies.   Review of Systems As per HPI   Physical Exam Triage Vital Signs ED Triage Vitals  Enc Vitals Group     BP      Pulse      Resp      Temp      Temp src      SpO2      Weight      Height      Head Circumference      Peak Flow      Pain Score      Pain Loc      Pain Edu?      Excl. in GC?  No data found.  Updated Vital Signs BP 133/71 (BP Location: Left Arm)   Pulse 96   Temp 97.6 F (36.4 C) (Temporal)   Resp 16   LMP 01/18/2019   SpO2 99%   Visual Acuity Right Eye Distance:   Left Eye Distance:   Bilateral Distance:    Right Eye Near:   Left Eye Near:    Bilateral Near:     Physical Exam Constitutional:      General: She is not in acute distress.    Appearance: She is normal weight. She is not ill-appearing.  HENT:     Head: Normocephalic and atraumatic.     Jaw: There is normal jaw occlusion. No tenderness or pain on movement.     Right Ear: Hearing, tympanic membrane, ear canal and external ear normal. No tenderness. No mastoid tenderness.     Left Ear: Hearing, tympanic membrane, ear canal and external ear normal. No tenderness. No mastoid tenderness.     Nose: No nasal deformity, septal deviation or nasal tenderness.     Right Turbinates: Not swollen or pale.     Left Turbinates: Not swollen or pale.     Right Sinus: No maxillary sinus tenderness or frontal sinus tenderness.     Left Sinus: No maxillary sinus tenderness or frontal sinus tenderness.     Comments: Moderate  bilateral turbinate edema with mucosal pallor.  No mucous plug, foreign body.  Patient does have ethmoid sinus tenderness    Mouth/Throat:     Lips: Pink. No lesions.     Mouth: Mucous membranes are moist. No injury.     Pharynx: Oropharynx is clear. Uvula midline. No posterior oropharyngeal erythema or uvula swelling.     Comments: no tonsillar exudate or hypertrophy Eyes:     General: No scleral icterus.    Conjunctiva/sclera: Conjunctivae normal.     Pupils: Pupils are equal, round, and reactive to light.  Cardiovascular:     Rate and Rhythm: Normal rate and regular rhythm.     Heart sounds: No murmur. No gallop.   Pulmonary:     Effort: Pulmonary effort is normal. No respiratory distress.     Breath sounds: No wheezing.  Musculoskeletal:        General: No swelling. Normal range of motion.     Cervical back: Normal range of motion and neck supple. No muscular tenderness.  Lymphadenopathy:     Cervical: No cervical adenopathy.  Skin:    General: Skin is warm.     Capillary Refill: Capillary refill takes less than 2 seconds.     Coloration: Skin is not cyanotic or pale.     Findings: No rash.  Neurological:     Mental Status: She is alert and oriented to person, place, and time.     Cranial Nerves: No cranial nerve deficit or dysarthria.     Motor: No weakness.     Coordination: Coordination normal.     Gait: Gait normal.     Deep Tendon Reflexes: Reflexes normal.  Psychiatric:        Mood and Affect: Mood normal.        Speech: Speech normal.        Behavior: Behavior normal.      UC Treatments / Results  Labs (all labs ordered are listed, but only abnormal results are displayed) Labs Reviewed  NOVEL CORONAVIRUS, NAA    EKG   Radiology No results found.  Procedures Procedures (including critical care time)  Medications Ordered in UC Medications - No data to display  Initial Impression / Assessment and Plan / UC Course  I have reviewed the triage vital  signs and the nursing notes.  Pertinent labs & imaging results that were available during my care of the patient were reviewed by me and considered in my medical decision making (see chart for details).     Patient afebrile, nontoxic, with SpO2 99%.  H&P consistent with sinusitis, likely second to allergic rhinitis, though given patient's pregnancy status and pending OB in office appointments Covid PCR pending.  Patient to quarantine until results are back.  We will continue supportive management.  Return precautions discussed, patient verbalized understanding and is agreeable to plan. Final Clinical Impressions(s) / UC Diagnoses   Final diagnoses:  Sinus pressure  [redacted] weeks gestation of pregnancy     Discharge Instructions     Take allergy med and flonase daily. Keep follow up w/ OB.  Your COVID test is pending - it is important to quarantine / isolate at home until your results are back. If you test positive and would like further evaluation for persistent or worsening symptoms, you may schedule an E-visit or virtual (video) visit throughout the [facility] app or website.  PLEASE NOTE: If you develop severe chest pain or shortness of breath please go to the ER or call 9-1-1 for further evaluation --> DO NOT schedule electronic or virtual visits for this. Please call our office for further guidance / recommendations as needed.  For information about the Covid vaccine, please visit [email or web address]    ED Prescriptions    Medication Sig Dispense Auth. Provider   fluticasone (FLONASE) 50 MCG/ACT nasal spray Place 1 spray into both nostrils daily. 16 g Hall-Potvin, [place], PA-C   loratadine (CLARITIN) 10 MG tablet Take 1 tablet (10 mg total) by mouth daily. 30 tablet Hall-Potvin, [place], PA-C     PDMP not reviewed this encounter.   Hall-Potvin, [place], [place] 04/20/19 1025

## 2019-04-20 NOTE — ED Triage Notes (Signed)
Pt presents to [facility] for assessment of 3-5 days of frontal headache.  Patient is [redacted] weeks pregnant and was told to come here for assessment.  Pt denies cough, sore throat, and denies nasal congestion, just c/o pressure to upper sinus area.

## 2019-04-21 LAB — NOVEL CORONAVIRUS, NAA: SARS-CoV-2, NAA: NOT DETECTED

## 2019-07-28 ENCOUNTER — Ambulatory Visit
Admission: EM | Admit: 2019-07-28 | Discharge: 2019-07-28 | Disposition: A | Discharge status: Home / Self Care | Payer: 59 | Attending: Emergency Medicine | Admitting: Emergency Medicine

## 2019-07-28 ENCOUNTER — Other Ambulatory Visit: Payer: Self-pay

## 2019-07-28 DIAGNOSIS — J029 Acute pharyngitis, unspecified: Secondary | ICD-10-CM | POA: Diagnosis present

## 2019-07-28 LAB — POCT RAPID STREP A (OFFICE): Rapid Strep A Screen: NEGATIVE

## 2019-07-28 MED ORDER — LIDOCAINE VISCOUS HCL 2 % MT SOLN: 15.0000 mL | OROMUCOSAL | 0 refills | Status: DC | PRN

## 2019-07-28 NOTE — Discharge Instructions (Signed)
Your rapid strep test was negative today.  The culture is pending.  Please look on your MyChart for test results.   We will notify you if the culture positive and outline a treatment plan at that time.   Please continue Tylenol and/or Ibuprofen as needed for fever, pain.  May try warm salt water gargles, cepacol lozenges, throat spray, warm tea or water with lemon/honey, or OTC cold relief medicine for throat discomfort.  May gargle, spit viscous lidocaine as prescribed for additional relief.  (Please note this may cause the back of your tongue and mouth to be numb as well) 

## 2019-07-28 NOTE — ED Triage Notes (Signed)
Pt c/o scratchy throat since 2am this morning. Sates hx of strep so wanted to catch it quick.

## 2019-07-28 NOTE — ED Provider Notes (Signed)
[facility]    CSN: [id number] Arrival date & time: 07/28/19  9622      History   Chief Complaint Chief Complaint  Patient presents with  . Sore Throat    HPI Betty Richardson is a 24 y.o. female presenting for scratchy throat since 2 AM this morning.  Patient states that she gets strep annually so she wanted to "catch a quick ".  Patient does have odynophagia, though denies drooling, dental pain, difficulty breathing.  No fever, cough, nausea, vomiting, abdominal pain.  No known sick contacts.  Has not tried anything for this.  Currently pregnant: LMP 01/18/2019.   History reviewed. No pertinent past medical history.  Patient Active Problem List   Diagnosis Date Noted  . Indication for care in labor or delivery 03/10/2018  . SVD (spontaneous vaginal delivery) 03/10/2018    Past Surgical History:  Procedure Laterality Date  . NO PAST SURGERIES      OB History    Gravida  2   Para  0   Term  0   Preterm  0   AB  0   Living  0     SAB  0   TAB  0   Ectopic  0   Multiple  0   Live Births  0            Home Medications    Prior to Admission medications   Medication Sig Start Date End Date Taking? Authorizing Provider  lidocaine (XYLOCAINE) 2 % solution Use as directed 15 mLs in the mouth or throat as needed for mouth pain. 07/28/19   Hall-Potvin, [place], PA-C  Prenatal Vit-Fe Fumarate-FA (PRENATAL MULTIVITAMIN) TABS tablet Take 1 tablet by mouth daily at 12 noon.    [provider]    Family History Family History  Problem Relation Age of Onset  . Heart failure Maternal Grandmother 70       type 2  . Heart failure Maternal Grandfather 60       type2  . Seizures Father     Social History Social History   Tobacco Use  . Smoking status: Never Smoker  . Smokeless tobacco: Never Used  Substance Use Topics  . Alcohol use: No  . Drug use: No     Allergies   Patient has no known allergies.   Review of  Systems As per HPI   Physical Exam Triage Vital Signs ED Triage Vitals  Enc Vitals Group     BP 07/28/19 0946 126/80     Pulse Rate 07/28/19 0946 94     Resp 07/28/19 0946 16     Temp 07/28/19 0946 98.1 F (36.7 C)     Temp Source 07/28/19 0946 Oral     SpO2 07/28/19 0946 98 %     Weight --      Height --      Head Circumference --      Peak Flow --      Pain Score 07/28/19 0949 0     Pain Loc --      Pain Edu? --      Excl. in [place]? --    No data found.  Updated Vital Signs BP 126/80 (BP Location: Left Arm)   Pulse 94   Temp 98.1 F (36.7 C) (Oral)   Resp 16   LMP 01/18/2019   SpO2 98%   Visual Acuity Right Eye Distance:   Left Eye Distance:   Bilateral Distance:  Right Eye Near:   Left Eye Near:    Bilateral Near:     Physical Exam Constitutional:      General: She is not in acute distress. HENT:     Head: Normocephalic and atraumatic.     Jaw: There is normal jaw occlusion. No tenderness or pain on movement.     Right Ear: Hearing, tympanic membrane, ear canal and external ear normal. No tenderness. No mastoid tenderness.     Left Ear: Hearing, tympanic membrane, ear canal and external ear normal. No tenderness. No mastoid tenderness.     Nose: No nasal deformity, septal deviation or nasal tenderness.     Right Turbinates: Not swollen or pale.     Left Turbinates: Not swollen or pale.     Right Sinus: No maxillary sinus tenderness or frontal sinus tenderness.     Left Sinus: No maxillary sinus tenderness or frontal sinus tenderness.     Mouth/Throat:     Lips: Pink. No lesions.     Mouth: Mucous membranes are moist. No injury.     Pharynx: Oropharynx is clear. Uvula midline. No posterior oropharyngeal erythema or uvula swelling.     Comments: no tonsillar exudate or hypertrophy Cardiovascular:     Rate and Rhythm: Normal rate and regular rhythm.  Pulmonary:     Effort: Pulmonary effort is normal. No respiratory distress.     Breath sounds: No  wheezing.  Musculoskeletal:     Cervical back: Normal range of motion and neck supple. No muscular tenderness.  Lymphadenopathy:     Cervical: No cervical adenopathy.  Neurological:     Mental Status: She is alert and oriented to person, place, and time.      UC Treatments / Results  Labs (all labs ordered are listed, but only abnormal results are displayed) Labs Reviewed  POCT RAPID STREP A (OFFICE) - Normal  CULTURE, GROUP A STREP ([facility])  NOVEL CORONAVIRUS, NAA    EKG   Radiology No results found.  Procedures Procedures (including critical care time)  Medications Ordered in UC Medications - No data to display  Initial Impression / Assessment and Plan / UC Course  I have reviewed the triage vital signs and the nursing notes.  Pertinent labs & imaging results that were available during my care of the patient were reviewed by me and considered in my medical decision making (see chart for details).     Patient afebrile, nontoxic, with SpO2 98%.  Rapid strep negative, culture pending.  Covid PCR pending.  Patient to quarantine until results are back.  We will treat supportively as outlined below.  Return precautions discussed, patient verbalized understanding and is agreeable to plan. Final Clinical Impressions(s) / UC Diagnoses   Final diagnoses:  Sore throat     Discharge Instructions     Your rapid strep test was negative today.  The culture is pending.  Please look on your MyChart for test results.   We will notify you if the culture positive and outline a treatment plan at that time.   Please continue Tylenol and/or Ibuprofen as needed for fever, pain.  May try warm salt water gargles, cepacol lozenges, throat spray, warm tea or water with lemon/honey, or OTC cold relief medicine for throat discomfort.  May gargle, spit viscous lidocaine as prescribed for additional relief.  (Please note this may cause the back of your tongue and mouth to be numb as  well)    ED Prescriptions    Medication Sig  Dispense Auth. Provider   lidocaine (XYLOCAINE) 2 % solution Use as directed 15 mLs in the mouth or throat as needed for mouth pain. 100 mL Hall-Potvin, [place], PA-C     PDMP not reviewed this encounter.   Hall-Potvin, [place], [place] 07/28/19 1507

## 2019-07-29 LAB — NOVEL CORONAVIRUS, NAA: SARS-CoV-2, NAA: NOT DETECTED

## 2019-07-29 LAB — SARS-COV-2, NAA 2 DAY TAT

## 2019-07-30 LAB — CULTURE, GROUP A STREP (THRC)

## 2019-07-31 DIAGNOSIS — Z3A28 28 weeks gestation of pregnancy: Secondary | ICD-10-CM | POA: Diagnosis not present

## 2019-07-31 DIAGNOSIS — Z23 Encounter for immunization: Secondary | ICD-10-CM | POA: Diagnosis not present

## 2019-07-31 DIAGNOSIS — Z3689 Encounter for other specified antenatal screening: Secondary | ICD-10-CM | POA: Diagnosis not present

## 2019-07-31 LAB — CULTURE, GROUP A STREP (THRC)

## 2019-08-14 DIAGNOSIS — O4403 Placenta previa specified as without hemorrhage, third trimester: Secondary | ICD-10-CM | POA: Diagnosis not present

## 2019-08-14 DIAGNOSIS — Z3A30 30 weeks gestation of pregnancy: Secondary | ICD-10-CM | POA: Diagnosis not present

## 2019-09-13 DIAGNOSIS — Z3A34 34 weeks gestation of pregnancy: Secondary | ICD-10-CM | POA: Diagnosis not present

## 2019-09-13 DIAGNOSIS — O4443 Low lying placenta NOS or without hemorrhage, third trimester: Secondary | ICD-10-CM | POA: Diagnosis not present

## 2019-09-18 ENCOUNTER — Encounter (HOSPITAL_COMMUNITY): Payer: Self-pay | Admitting: *Deleted

## 2019-09-18 ENCOUNTER — Telehealth (HOSPITAL_COMMUNITY): Payer: Self-pay | Admitting: *Deleted

## 2019-09-18 NOTE — Telephone Encounter (Signed)
Preadmission screen  

## 2019-09-18 NOTE — Patient Instructions (Signed)
Betty Richardson  09/18/2019   Your procedure is scheduled on:  09/30/2019  Arrive at 0800 at Entrance C on [address] at [facility]  and [employer]. You are invited to use the FREE valet parking or use the Visitor's parking deck.  Pick up the phone at the desk and dial [phone].  Call this number if you have problems the morning of surgery: [phone]  Remember:   Do not eat food:(After Midnight) Desps de medianoche.  Do not drink clear liquids: (After Midnight) Desps de medianoche.  Take these medicines the morning of surgery with A SIP OF WATER:  none   Do not wear jewelry, make-up or nail polish.  Do not wear lotions, powders, or perfumes. Do not wear deodorant.  Do not shave 48 hours prior to surgery.  Do not bring valuables to the hospital.  [facility] is not   responsible for any belongings or valuables brought to the hospital.  Contacts, dentures or bridgework may not be worn into surgery.  Leave suitcase in the car. After surgery it may be brought to your room.  For patients admitted to the hospital, checkout time is 11:00 AM the day of              discharge.      Please read over the following fact sheets that you were given:     Preparing for Surgery

## 2019-09-19 ENCOUNTER — Encounter (HOSPITAL_COMMUNITY): Payer: Self-pay

## 2019-09-20 ENCOUNTER — Inpatient Hospital Stay (HOSPITAL_COMMUNITY)
Admission: AD | Admit: 2019-09-20 | Discharge: 2019-09-20 | Disposition: A | Discharge status: Home / Self Care | Payer: Medicaid Other | Attending: Obstetrics and Gynecology | Admitting: Obstetrics and Gynecology

## 2019-09-20 DIAGNOSIS — Z3A00 Weeks of gestation of pregnancy not specified: Secondary | ICD-10-CM | POA: Insufficient documentation

## 2019-09-20 DIAGNOSIS — Z3685 Encounter for antenatal screening for Streptococcus B: Secondary | ICD-10-CM | POA: Diagnosis not present

## 2019-09-20 DIAGNOSIS — Z3A35 35 weeks gestation of pregnancy: Secondary | ICD-10-CM | POA: Diagnosis not present

## 2019-09-20 DIAGNOSIS — O4403 Placenta previa specified as without hemorrhage, third trimester: Secondary | ICD-10-CM | POA: Diagnosis not present

## 2019-09-20 DIAGNOSIS — O09293 Supervision of pregnancy with other poor reproductive or obstetric history, third trimester: Secondary | ICD-10-CM | POA: Diagnosis not present

## 2019-09-20 DIAGNOSIS — Z113 Encounter for screening for infections with a predominantly sexual mode of transmission: Secondary | ICD-10-CM | POA: Diagnosis not present

## 2019-09-20 MED ORDER — BETAMETHASONE SOD PHOS & ACET 6 (3-3) MG/ML IJ SUSP
12.0000 mg | Freq: Once | INTRAMUSCULAR | Status: AC
  Administered 2019-09-20: 12 mg via INTRAMUSCULAR
  Filled 2019-09-20: qty 5

## 2019-09-20 NOTE — MAU Note (Signed)
Pt sent from office for BMTZ.

## 2019-09-20 NOTE — MAU Note (Signed)
Received BMTZ. Will return tomorrow.

## 2019-09-21 ENCOUNTER — Other Ambulatory Visit: Payer: Self-pay

## 2019-09-21 ENCOUNTER — Inpatient Hospital Stay (HOSPITAL_COMMUNITY)
Admission: AD | Admit: 2019-09-21 | Discharge: 2019-09-21 | Disposition: A | Discharge status: Home / Self Care | Payer: Medicaid Other | Attending: Obstetrics and Gynecology | Admitting: Obstetrics and Gynecology

## 2019-09-21 DIAGNOSIS — Z3A37 37 weeks gestation of pregnancy: Secondary | ICD-10-CM | POA: Diagnosis not present

## 2019-09-21 DIAGNOSIS — O471 False labor at or after 37 completed weeks of gestation: Secondary | ICD-10-CM | POA: Diagnosis not present

## 2019-09-21 DIAGNOSIS — O4703 False labor before 37 completed weeks of gestation, third trimester: Secondary | ICD-10-CM

## 2019-09-21 MED ORDER — BETAMETHASONE SOD PHOS & ACET 6 (3-3) MG/ML IJ SUSP
12.0000 mg | Freq: Once | INTRAMUSCULAR | Status: AC
  Administered 2019-09-21: 12 mg via INTRAMUSCULAR
  Filled 2019-09-21: qty 5

## 2019-09-21 NOTE — MAU Provider Note (Signed)
Chief Complaint: Injections   None     SUBJECTIVE HPI: Betty Richardson is a 24 y.o. G2P0000 who presents to maternity admissions for second dose of betamethasone. She reports cramping 2-3 times per hour that are irregular and mild.  There are no other symptoms and she is feeling normal fetal movement.    Past Medical History:  Diagnosis Date  . Medical history non-contributory    Past Surgical History:  Procedure Laterality Date  . NO PAST SURGERIES     Social History   Socioeconomic History  . Marital status: Single    Spouse name: Not on file  . Number of children: Not on file  . Years of education: Not on file  . Highest education level: Not on file  Occupational History  . Not on file  Tobacco Use  . Smoking status: Never Smoker  . Smokeless tobacco: Never Used  Vaping Use  . Vaping Use: Never used  Substance and Sexual Activity  . Alcohol use: No  . Drug use: No  . Sexual activity: Yes    Birth control/protection: None  Other Topics Concern  . Not on file  Social History Narrative  . Not on file   Social Determinants of Health   Financial Resource Strain:   . Difficulty of Paying Living Expenses:   Food Insecurity:   . Worried About Programme researcher, broadcasting/film/video in the Last Year:   . Barista in the Last Year:   Transportation Needs:   . Freight forwarder (Medical):   Marland Kitchen Lack of Transportation (Non-Medical):   Physical Activity:   . Days of Exercise per Week:   . Minutes of Exercise per Session:   Stress:   . Feeling of Stress :   Social Connections:   . Frequency of Communication with Friends and Family:   . Frequency of Social Gatherings with Friends and Family:   . Attends Religious Services:   . Active Member of Clubs or Organizations:   . Attends Banker Meetings:   Marland Kitchen Marital Status:   Intimate Partner Violence:   . Fear of Current or Ex-Partner:   . Emotionally Abused:   Marland Kitchen Physically Abused:   . Sexually Abused:    No  current facility-administered medications on file prior to encounter.   Current Outpatient Medications on File Prior to Encounter  Medication Sig Dispense Refill  . lidocaine (XYLOCAINE) 2 % solution Use as directed 15 mLs in the mouth or throat as needed for mouth pain. (Patient not taking: Reported on 09/17/2019) 100 mL 0  . Prenatal Vit-Fe Fumarate-FA (PRENATAL MULTIVITAMIN) TABS tablet Take 1 tablet by mouth daily at 12 noon.     No Known Allergies  ROS:  Review of Systems  Constitutional: Negative for chills, fatigue and fever.  Eyes: Negative for visual disturbance.  Respiratory: Negative for shortness of breath.   Cardiovascular: Negative for chest pain.  Gastrointestinal: Positive for abdominal pain. Negative for nausea and vomiting.  Genitourinary: Negative for difficulty urinating, dysuria, flank pain, pelvic pain, vaginal bleeding, vaginal discharge and vaginal pain.  Neurological: Negative for dizziness and headaches.  Psychiatric/Behavioral: Negative.     I have reviewed patient's Past Medical Hx, Surgical Hx, Family Hx, Social Hx, medications and allergies.   Physical Exam   Patient Vitals for the past 24 hrs:  BP Temp Temp src Pulse Resp SpO2  09/21/19 1441 111/63 98.7 F (37.1 C) Oral (!) 112 16 99 %   Physical Exam VS  reviewed, nursing note reviewed,  Constitutional: well developed, well nourished, no distress HEENT: normocephalic CV: normal rate Pulm/chest wall: normal effort Abdomen: soft Neuro: alert and oriented x 3 Skin: warm, dry Psych: affect normal    ASSESSMENT MSE Complete  PLAN BMZ given in MAU, pt stable at time of discharge PTL precautions reviewed  Hurshel Party, CNM 09/21/2019 3:00 PM

## 2019-09-21 NOTE — MAU Note (Signed)
Betty Richardson is a 24 y.o. at [redacted]w[redacted]d here in MAU reporting: here for 2nd BMZ. Having slight intermittent abdominal pain. Pain is the same as yesterday. States they come 1-2 times every couple of hours. No bleeding or LOF. +FM  Pain score: 6/10  Vitals:   09/21/19 1441  BP: 111/63  Pulse: (!) 112  Resp: 16  Temp: 98.7 F (37.1 C)  SpO2: 99%     FHT: 126  Lab orders placed from triage: none

## 2019-09-28 ENCOUNTER — Other Ambulatory Visit: Payer: Self-pay

## 2019-09-28 ENCOUNTER — Other Ambulatory Visit (HOSPITAL_COMMUNITY)
Admission: RE | Admit: 2019-09-28 | Discharge: 2019-09-28 | Disposition: A | Discharge status: Home / Self Care | Payer: Medicaid Other | Source: Ambulatory Visit | Attending: Obstetrics and Gynecology | Admitting: Obstetrics and Gynecology

## 2019-09-28 DIAGNOSIS — Z20822 Contact with and (suspected) exposure to covid-19: Secondary | ICD-10-CM | POA: Diagnosis not present

## 2019-09-28 DIAGNOSIS — Z01812 Encounter for preprocedural laboratory examination: Secondary | ICD-10-CM | POA: Insufficient documentation

## 2019-09-28 HISTORY — DX: Other specified health status: Z78.9

## 2019-09-28 LAB — CBC
HCT: 37.7 % (ref 36.0–46.0)
Hemoglobin: 12.5 g/dL (ref 12.0–15.0)
MCH: 28.6 pg (ref 26.0–34.0)
MCHC: 33.2 g/dL (ref 30.0–36.0)
MCV: 86.3 fL (ref 80.0–100.0)
Platelets: 118 10*3/uL — ABNORMAL LOW (ref 150–400)
RBC: 4.37 MIL/uL (ref 3.87–5.11)
RDW: 13.3 % (ref 11.5–15.5)
WBC: 13.8 10*3/uL — ABNORMAL HIGH (ref 4.0–10.5)
nRBC: 0 % (ref 0.0–0.2)

## 2019-09-28 LAB — SARS CORONAVIRUS 2 (TAT 6-24 HRS): SARS Coronavirus 2: NEGATIVE

## 2019-09-28 NOTE — H&P (Signed)
Betty Richardson is a 24 y.o. female G2P1001 presenting for scheduled primary cesarean section due to persistent low lying placenta. Pt is dated per 8week [place]. Her pregnancy was complicated by benign gestational thrombocytopenia - 122K at 28weeks -  and low lying placenta still present at 35 weeks ( was previa before). Pt denies any cramping or bleeding. She received BMZ at 35weeks due to noted preterm cervical dilation.  GBS neg OB History    Gravida  2   Para  1   Term  0   Preterm  0   AB  0   Living  0     SAB  0   TAB  0   Ectopic  0   Multiple  0   Live Births  0          Past Medical History:  Diagnosis Date  . Medical history non-contributory    Past Surgical History:  Procedure Laterality Date  . NO PAST SURGERIES     Family History: family history includes Heart failure (age of onset: 83) in her maternal grandfather; Heart failure (age of onset: 39) in her maternal grandmother; Seizures in her father. Social History:  reports that she has never smoked. She has never used smokeless tobacco. She reports that she does not drink alcohol and does not use drugs.     Maternal Diabetes: No Genetic Screening: Declined Maternal Ultrasounds/Referrals: Normal Fetal Ultrasounds or other Referrals:  None Maternal Substance Abuse:  No Significant Maternal Medications:  None Significant Maternal Lab Results:  Group B Strep negative Other Comments:  None  Review of Systems  Constitutional: Positive for fatigue. Negative for activity change.  Eyes: Negative for photophobia and visual disturbance.  Respiratory: Negative for chest tightness and shortness of breath.   Cardiovascular: Positive for leg swelling. Negative for palpitations.  Genitourinary: Positive for pelvic pain.  Neurological: Negative for light-headedness.  Psychiatric/Behavioral: The patient is nervous/anxious.    Maternal Medical History:  Reason for admission: Scheduled cesarean section for low  lying placenta 1.3cm from os  Contractions: Frequency: rare.    Fetal activity: Perceived fetal activity is normal.    Prenatal complications: Benign gestational thrombocytopenia and low lying placenta  Prenatal Complications - Diabetes: none.      Last menstrual period 01/18/2019, unknown if currently breastfeeding. Maternal Exam:  Uterine Assessment: Contraction strength is mild.  Contraction frequency is rare.   Abdomen: Patient reports generalized tenderness.  Estimated fetal weight is AGA.   Fetal presentation: vertex  Introitus: Normal vulva. Vulva is negative for condylomata.  Normal vagina.  Vagina is negative for condylomata.  Pelvis: of concern for delivery.   Cervix: Cervix evaluated by digital exam.     Fetal Exam Fetal Monitor Review: Baseline rate: 135.  Variability: moderate (6-25 bpm).   Pattern: accelerations present and no decelerations.    Fetal State Assessment: Category I - tracings are normal.     Physical Exam Vitals reviewed. Exam conducted with a chaperone present.  Constitutional:      Appearance: Normal appearance.  Cardiovascular:     Rate and Rhythm: Normal rate.     Pulses: Normal pulses.  Pulmonary:     Effort: Pulmonary effort is normal.  Abdominal:     Tenderness: There is generalized abdominal tenderness.  Genitourinary:    General: Normal vulva.  Musculoskeletal:        General: Normal range of motion.     Cervical back: Normal range of motion.  Skin:  General: Skin is warm.  Neurological:     General: No focal deficit present.     Mental Status: She is alert and oriented to person, place, and time.  Psychiatric:        Mood and Affect: Mood normal.        Behavior: Behavior normal.        Thought Content: Thought content normal.        Judgment: Judgment normal.     Prenatal labs: ABO, Rh: --/--/A POS (07/10 2993) Antibody: NEG (07/10 0934) Rubella: Immune (01/07 0000) RPR: Nonreactive (01/07 0000)  HBsAg:  Negative (01/07 0000)  HIV: Non-reactive (01/07 0000)  GBS:     Assessment/Plan: 23yo G2P1001 female presenting at 93 5/7wks for prime c/s due to low lying placenta -Admit -NPO per ERAS -Ancef preop -SCDs to OR -Spinal per anesthesia -Sars covid screen -Confirm consent then to OR when ready   Janean Sark Glenette Bookwalter 09/28/2019, 11:06 AM

## 2019-09-28 NOTE — MAU Note (Signed)
Pt here for covid swab and lab draw. Denies symptoms or sick contacts. Swab collected.  

## 2019-09-29 ENCOUNTER — Encounter (HOSPITAL_COMMUNITY): Payer: Self-pay | Admitting: Obstetrics and Gynecology

## 2019-09-29 LAB — RPR: RPR Ser Ql: NONREACTIVE

## 2019-09-29 NOTE — Anesthesia Preprocedure Evaluation (Addendum)
Anesthesia Evaluation  Patient identified by MRN, date of birth, ID band Patient awake    Reviewed: Allergy & Precautions, NPO status , Patient's Chart, lab work & pertinent test results  Airway Mallampati: II  TM Distance: >3 FB Neck ROM: Full    Dental no notable dental hx. (+) Teeth Intact   Pulmonary neg pulmonary ROS,    Pulmonary exam normal breath sounds clear to auscultation       Cardiovascular negative cardio ROS Normal cardiovascular exam Rhythm:Regular Rate:Normal     Neuro/Psych negative neurological ROS  negative psych ROS   GI/Hepatic Neg liver ROS, GERD  ,  Endo/Other  Obesity  Renal/GU negative Renal ROS  negative genitourinary   Musculoskeletal negative musculoskeletal ROS (+)   Abdominal (+) + obese,   Peds  Hematology Thrombocytopenia- probably gestational   Anesthesia Other Findings   Reproductive/Obstetrics (+) Pregnancy Placenta previa  37-[redacted] weeks gestation                            Anesthesia Physical Anesthesia Plan  ASA: II  Anesthesia Plan: Spinal   Post-op Pain Management:    Induction:   PONV Risk Score and Plan: 4 or greater and Scopolamine patch - Pre-op and Treatment may vary due to age or medical condition  Airway Management Planned: Natural Airway  Additional Equipment:   Intra-op Plan:   Post-operative Plan: Extubation in OR  Informed Consent: I have reviewed the patients History and Physical, chart, labs and discussed the procedure including the risks, benefits and alternatives for the proposed anesthesia with the patient or authorized representative who has indicated his/her understanding and acceptance.     Dental advisory given  Plan Discussed with: CRNA and Surgeon  Anesthesia Plan Comments: (Will recheck platelet count DOS.)       Anesthesia Quick Evaluation

## 2019-09-30 ENCOUNTER — Other Ambulatory Visit: Payer: Self-pay

## 2019-09-30 ENCOUNTER — Encounter (HOSPITAL_COMMUNITY): Payer: Self-pay | Admitting: Obstetrics and Gynecology

## 2019-09-30 ENCOUNTER — Inpatient Hospital Stay (HOSPITAL_COMMUNITY)
Admission: RE | Admit: 2019-09-30 | Discharge: 2019-10-02 | DRG: 787 | Disposition: A | Discharge status: Home / Self Care | Payer: Medicaid Other | Attending: Obstetrics and Gynecology | Admitting: Obstetrics and Gynecology

## 2019-09-30 ENCOUNTER — Inpatient Hospital Stay (HOSPITAL_COMMUNITY): Payer: Medicaid Other | Admitting: Anesthesiology

## 2019-09-30 ENCOUNTER — Encounter (HOSPITAL_COMMUNITY)
Admission: RE | Disposition: A | Discharge status: Home / Self Care | Payer: Self-pay | Source: Home / Self Care | Attending: Obstetrics and Gynecology

## 2019-09-30 DIAGNOSIS — O9081 Anemia of the puerperium: Secondary | ICD-10-CM | POA: Diagnosis not present

## 2019-09-30 DIAGNOSIS — E669 Obesity, unspecified: Secondary | ICD-10-CM | POA: Diagnosis present

## 2019-09-30 DIAGNOSIS — O99214 Obesity complicating childbirth: Secondary | ICD-10-CM | POA: Diagnosis present

## 2019-09-30 DIAGNOSIS — O4443 Low lying placenta NOS or without hemorrhage, third trimester: Secondary | ICD-10-CM | POA: Diagnosis not present

## 2019-09-30 DIAGNOSIS — O9912 Other diseases of the blood and blood-forming organs and certain disorders involving the immune mechanism complicating childbirth: Secondary | ICD-10-CM | POA: Diagnosis present

## 2019-09-30 DIAGNOSIS — D6959 Other secondary thrombocytopenia: Secondary | ICD-10-CM | POA: Diagnosis not present

## 2019-09-30 DIAGNOSIS — D62 Acute posthemorrhagic anemia: Secondary | ICD-10-CM | POA: Diagnosis not present

## 2019-09-30 DIAGNOSIS — Z3A37 37 weeks gestation of pregnancy: Secondary | ICD-10-CM | POA: Diagnosis not present

## 2019-09-30 DIAGNOSIS — O4403 Placenta previa specified as without hemorrhage, third trimester: Secondary | ICD-10-CM | POA: Diagnosis not present

## 2019-09-30 DIAGNOSIS — Z98891 History of uterine scar from previous surgery: Secondary | ICD-10-CM

## 2019-09-30 LAB — PREPARE RBC (CROSSMATCH)

## 2019-09-30 LAB — CBC
HCT: 37.3 % (ref 36.0–46.0)
Hemoglobin: 12.2 g/dL (ref 12.0–15.0)
MCH: 28.6 pg (ref 26.0–34.0)
MCHC: 32.7 g/dL (ref 30.0–36.0)
MCV: 87.4 fL (ref 80.0–100.0)
Platelets: 120 10*3/uL — ABNORMAL LOW (ref 150–400)
RBC: 4.27 MIL/uL (ref 3.87–5.11)
RDW: 13.6 % (ref 11.5–15.5)
WBC: 11.8 10*3/uL — ABNORMAL HIGH (ref 4.0–10.5)
nRBC: 0 % (ref 0.0–0.2)

## 2019-09-30 SURGERY — Surgical Case
Anesthesia: Spinal | Site: Abdomen | Wound class: Clean Contaminated

## 2019-09-30 MED ORDER — OXYCODONE HCL 5 MG PO TABS
5.0000 mg | ORAL_TABLET | ORAL | Status: DC | PRN
  Administered 2019-10-01: 10 mg via ORAL
  Administered 2019-10-01: 5 mg via ORAL
  Administered 2019-10-01: 10 mg via ORAL
  Administered 2019-10-01: 5 mg via ORAL
  Administered 2019-10-01: 10 mg via ORAL
  Administered 2019-10-02: 5 mg via ORAL
  Administered 2019-10-02: 10 mg via ORAL
  Filled 2019-09-30 (×3): qty 2
  Filled 2019-09-30 (×3): qty 1
  Filled 2019-09-30: qty 2

## 2019-09-30 MED ORDER — IBUPROFEN 800 MG PO TABS
800.0000 mg | ORAL_TABLET | Freq: Three times a day (TID) | ORAL | Status: DC
  Administered 2019-09-30 – 2019-10-02 (×6): 800 mg via ORAL
  Filled 2019-09-30 (×7): qty 1

## 2019-09-30 MED ORDER — DIBUCAINE (PERIANAL) 1 % EX OINT: 1.0000 "application " | TOPICAL_OINTMENT | CUTANEOUS | Status: DC | PRN

## 2019-09-30 MED ORDER — KETOROLAC TROMETHAMINE 30 MG/ML IJ SOLN: 30.0000 mg | Freq: Four times a day (QID) | INTRAMUSCULAR | Status: AC | PRN

## 2019-09-30 MED ORDER — FENTANYL CITRATE (PF) 100 MCG/2ML IJ SOLN
INTRAMUSCULAR | Status: DC | PRN
  Administered 2019-09-30: 15 ug via INTRATHECAL

## 2019-09-30 MED ORDER — COCONUT OIL OIL: 1.0000 "application " | TOPICAL_OIL | Status: DC | PRN

## 2019-09-30 MED ORDER — PHENYLEPHRINE HCL-NACL 20-0.9 MG/250ML-% IV SOLN
INTRAVENOUS | Status: DC | PRN
  Administered 2019-09-30: 60 ug/min via INTRAVENOUS

## 2019-09-30 MED ORDER — CEFAZOLIN SODIUM-DEXTROSE 2-4 GM/100ML-% IV SOLN
2.0000 g | INTRAVENOUS | Status: AC
  Administered 2019-09-30: 2 g via INTRAVENOUS

## 2019-09-30 MED ORDER — CHLORHEXIDINE GLUCONATE 0.12 % MT SOLN
15.0000 mL | Freq: Once | OROMUCOSAL | Status: AC
  Administered 2019-09-30: 15 mL via OROMUCOSAL
  Filled 2019-09-30 (×2): qty 15

## 2019-09-30 MED ORDER — NALBUPHINE HCL 10 MG/ML IJ SOLN: 5.0000 mg | INTRAMUSCULAR | Status: DC | PRN

## 2019-09-30 MED ORDER — MORPHINE SULFATE (PF) 0.5 MG/ML IJ SOLN
INTRAMUSCULAR | Status: DC | PRN
  Administered 2019-09-30: .15 mg via INTRATHECAL

## 2019-09-30 MED ORDER — MORPHINE SULFATE (PF) 0.5 MG/ML IJ SOLN
INTRAMUSCULAR | Status: AC
  Filled 2019-09-30: qty 10

## 2019-09-30 MED ORDER — OXYTOCIN-SODIUM CHLORIDE 30-0.9 UT/500ML-% IV SOLN
INTRAVENOUS | Status: DC | PRN
  Administered 2019-09-30: 300 mL via INTRAVENOUS

## 2019-09-30 MED ORDER — NALOXONE HCL 0.4 MG/ML IJ SOLN: 0.4000 mg | INTRAMUSCULAR | Status: DC | PRN

## 2019-09-30 MED ORDER — DIPHENHYDRAMINE HCL 50 MG/ML IJ SOLN
12.5000 mg | INTRAMUSCULAR | Status: DC | PRN
  Administered 2019-09-30: 12.5 mg via INTRAVENOUS

## 2019-09-30 MED ORDER — ONDANSETRON HCL 4 MG/2ML IJ SOLN
INTRAMUSCULAR | Status: DC | PRN
  Administered 2019-09-30: 4 mg via INTRAVENOUS

## 2019-09-30 MED ORDER — DIPHENHYDRAMINE HCL 25 MG PO CAPS: 25.0000 mg | ORAL_CAPSULE | ORAL | Status: DC | PRN

## 2019-09-30 MED ORDER — POVIDONE-IODINE 10 % EX SWAB
2.0000 "application " | Freq: Once | CUTANEOUS | Status: AC
  Administered 2019-09-30: 2 via TOPICAL

## 2019-09-30 MED ORDER — BUPIVACAINE IN DEXTROSE 0.75-8.25 % IT SOLN
INTRATHECAL | Status: DC | PRN
  Administered 2019-09-30: 1.8 mL via INTRATHECAL

## 2019-09-30 MED ORDER — LACTATED RINGERS IV SOLN: INTRAVENOUS | Status: DC

## 2019-09-30 MED ORDER — SENNOSIDES-DOCUSATE SODIUM 8.6-50 MG PO TABS
2.0000 | ORAL_TABLET | ORAL | Status: DC
  Administered 2019-10-01 (×2): 2 via ORAL
  Filled 2019-09-30 (×2): qty 2

## 2019-09-30 MED ORDER — CEFAZOLIN SODIUM-DEXTROSE 2-4 GM/100ML-% IV SOLN
INTRAVENOUS | Status: AC
  Filled 2019-09-30: qty 100

## 2019-09-30 MED ORDER — ACETAMINOPHEN 500 MG PO TABS
ORAL_TABLET | ORAL | Status: AC
  Filled 2019-09-30: qty 2

## 2019-09-30 MED ORDER — DEXAMETHASONE SODIUM PHOSPHATE 4 MG/ML IJ SOLN
INTRAMUSCULAR | Status: DC | PRN
  Administered 2019-09-30: 4 mg via INTRAVENOUS

## 2019-09-30 MED ORDER — FENTANYL CITRATE (PF) 100 MCG/2ML IJ SOLN: 25.0000 ug | INTRAMUSCULAR | Status: DC | PRN

## 2019-09-30 MED ORDER — WITCH HAZEL-GLYCERIN EX PADS: 1.0000 "application " | MEDICATED_PAD | CUTANEOUS | Status: DC | PRN

## 2019-09-30 MED ORDER — SIMETHICONE 80 MG PO CHEW: 80.0000 mg | CHEWABLE_TABLET | ORAL | Status: DC | PRN

## 2019-09-30 MED ORDER — ZOLPIDEM TARTRATE 5 MG PO TABS: 5.0000 mg | ORAL_TABLET | Freq: Every evening | ORAL | Status: DC | PRN

## 2019-09-30 MED ORDER — KETOROLAC TROMETHAMINE 30 MG/ML IJ SOLN
30.0000 mg | Freq: Four times a day (QID) | INTRAMUSCULAR | Status: AC | PRN
  Administered 2019-09-30: 30 mg via INTRAVENOUS
  Filled 2019-09-30: qty 1

## 2019-09-30 MED ORDER — SODIUM CHLORIDE 0.9 % IR SOLN
Status: DC | PRN
  Administered 2019-09-30: 1000 mL

## 2019-09-30 MED ORDER — SCOPOLAMINE 1 MG/3DAYS TD PT72
MEDICATED_PATCH | TRANSDERMAL | Status: AC
  Filled 2019-09-30: qty 1

## 2019-09-30 MED ORDER — DIPHENHYDRAMINE HCL 50 MG/ML IJ SOLN
INTRAMUSCULAR | Status: AC
  Filled 2019-09-30: qty 1

## 2019-09-30 MED ORDER — DIPHENHYDRAMINE HCL 25 MG PO CAPS: 25.0000 mg | ORAL_CAPSULE | Freq: Four times a day (QID) | ORAL | Status: DC | PRN

## 2019-09-30 MED ORDER — MEPERIDINE HCL 25 MG/ML IJ SOLN: 6.2500 mg | INTRAMUSCULAR | Status: DC | PRN

## 2019-09-30 MED ORDER — SOD CITRATE-CITRIC ACID 500-334 MG/5ML PO SOLN
30.0000 mL | Freq: Once | ORAL | Status: AC
  Administered 2019-09-30: 30 mL via ORAL

## 2019-09-30 MED ORDER — SODIUM CHLORIDE 0.9% FLUSH: 3.0000 mL | INTRAVENOUS | Status: DC | PRN

## 2019-09-30 MED ORDER — SIMETHICONE 80 MG PO CHEW
80.0000 mg | CHEWABLE_TABLET | Freq: Three times a day (TID) | ORAL | Status: DC
  Administered 2019-09-30 – 2019-10-02 (×6): 80 mg via ORAL
  Filled 2019-09-30 (×7): qty 1

## 2019-09-30 MED ORDER — OXYTOCIN-SODIUM CHLORIDE 30-0.9 UT/500ML-% IV SOLN: 2.5000 [IU]/h | INTRAVENOUS | Status: AC

## 2019-09-30 MED ORDER — NALBUPHINE HCL 10 MG/ML IJ SOLN: 5.0000 mg | Freq: Once | INTRAMUSCULAR | Status: DC | PRN

## 2019-09-30 MED ORDER — SOD CITRATE-CITRIC ACID 500-334 MG/5ML PO SOLN
ORAL | Status: AC
  Filled 2019-09-30: qty 30

## 2019-09-30 MED ORDER — SCOPOLAMINE 1 MG/3DAYS TD PT72
1.0000 | MEDICATED_PATCH | Freq: Once | TRANSDERMAL | Status: DC
  Administered 2019-09-30: 1.5 mg via TRANSDERMAL

## 2019-09-30 MED ORDER — TETANUS-DIPHTH-ACELL PERTUSSIS 5-2.5-18.5 LF-MCG/0.5 IM SUSP: 0.5000 mL | Freq: Once | INTRAMUSCULAR | Status: DC

## 2019-09-30 MED ORDER — ONDANSETRON HCL 4 MG/2ML IJ SOLN: 4.0000 mg | Freq: Three times a day (TID) | INTRAMUSCULAR | Status: DC | PRN

## 2019-09-30 MED ORDER — SIMETHICONE 80 MG PO CHEW
80.0000 mg | CHEWABLE_TABLET | ORAL | Status: DC
  Administered 2019-10-01 (×2): 80 mg via ORAL
  Filled 2019-09-30 (×2): qty 1

## 2019-09-30 MED ORDER — PRENATAL MULTIVITAMIN CH
1.0000 | ORAL_TABLET | Freq: Every day | ORAL | Status: DC
  Administered 2019-10-01 – 2019-10-02 (×2): 1 via ORAL
  Filled 2019-09-30 (×2): qty 1

## 2019-09-30 MED ORDER — STERILE WATER FOR IRRIGATION IR SOLN
Status: DC | PRN
  Administered 2019-09-30: 1000 mL

## 2019-09-30 MED ORDER — ORAL CARE MOUTH RINSE: 15.0000 mL | Freq: Once | OROMUCOSAL | Status: AC

## 2019-09-30 MED ORDER — NALOXONE HCL 4 MG/10ML IJ SOLN
1.0000 ug/kg/h | INTRAVENOUS | Status: DC | PRN
  Filled 2019-09-30: qty 5

## 2019-09-30 MED ORDER — ACETAMINOPHEN 500 MG PO TABS
1000.0000 mg | ORAL_TABLET | ORAL | Status: AC
  Administered 2019-09-30: 1000 mg via ORAL

## 2019-09-30 MED ORDER — MENTHOL 3 MG MT LOZG: 1.0000 | LOZENGE | OROMUCOSAL | Status: DC | PRN

## 2019-09-30 MED ORDER — FENTANYL CITRATE (PF) 100 MCG/2ML IJ SOLN
INTRAMUSCULAR | Status: AC
  Filled 2019-09-30: qty 2

## 2019-09-30 SURGICAL SUPPLY — 29 items
BENZOIN TINCTURE PRP APPL 2/3 (GAUZE/BANDAGES/DRESSINGS) ×4 IMPLANT
CHLORAPREP W/TINT 26ML (MISCELLANEOUS) ×2 IMPLANT
CLAMP CORD UMBIL (MISCELLANEOUS) ×2 IMPLANT
CLOTH BEACON ORANGE TIMEOUT ST (SAFETY) ×2 IMPLANT
DRAPE C SECTION CLR SCREEN (DRAPES) ×2 IMPLANT
DRSG OPSITE POSTOP 4X10 (GAUZE/BANDAGES/DRESSINGS) ×2 IMPLANT
ELECT REM PT RETURN 9FT ADLT (ELECTROSURGICAL) ×2
ELECTRODE REM PT RTRN 9FT ADLT (ELECTROSURGICAL) ×1 IMPLANT
GLOVE BIO SURGEON STRL SZ 6.5 (GLOVE) ×2 IMPLANT
GLOVE BIOGEL PI IND STRL 7.0 (GLOVE) ×2 IMPLANT
GLOVE BIOGEL PI INDICATOR 7.0 (GLOVE) ×2
GOWN STRL REUS W/TWL LRG LVL3 (GOWN DISPOSABLE) ×4 IMPLANT
NS IRRIG 1000ML POUR BTL (IV SOLUTION) ×2 IMPLANT
PACK C SECTION WH (CUSTOM PROCEDURE TRAY) ×2 IMPLANT
PAD OB MATERNITY 4.3X12.25 (PERSONAL CARE ITEMS) ×2 IMPLANT
RETRACTOR WND ALEXIS 25 LRG (MISCELLANEOUS) ×1 IMPLANT
RTRCTR C-SECT PINK 25CM LRG (MISCELLANEOUS) ×2 IMPLANT
RTRCTR WOUND ALEXIS 25CM LRG (MISCELLANEOUS) ×2
STRIP CLOSURE SKIN 1/2X4 (GAUZE/BANDAGES/DRESSINGS) ×4 IMPLANT
SUT CHROMIC 1 CTX 36 (SUTURE) ×4 IMPLANT
SUT PLAIN 2 0 XLH (SUTURE) ×2 IMPLANT
SUT VIC AB 0 CT1 27 (SUTURE) ×2
SUT VIC AB 0 CT1 27XBRD ANBCTR (SUTURE) ×2 IMPLANT
SUT VIC AB 2-0 CT1 27 (SUTURE) ×1
SUT VIC AB 2-0 CT1 TAPERPNT 27 (SUTURE) ×1 IMPLANT
SUT VIC AB 4-0 KS 27 (SUTURE) ×2 IMPLANT
TOWEL OR 17X24 6PK STRL BLUE (TOWEL DISPOSABLE) ×2 IMPLANT
TRAY FOLEY W/BAG SLVR 14FR LF (SET/KITS/TRAYS/PACK) ×2 IMPLANT
WATER STERILE IRR 1000ML POUR (IV SOLUTION) ×2 IMPLANT

## 2019-09-30 NOTE — Lactation Note (Signed)
This note was copied from a baby's chart. Lactation Consultation Note  Patient Name: Boy Leveda Kendrix [id number] Date: 09/30/2019 Reason for consult: Follow-up assessment;Mother's request  Randel Books is 48 hours old. Mother is holding baby skin to skin upon arrival. Mother requested assistance with latching baby to breast. Baby had 2 mL of hand expressed colostrum via spoon with RN assistance. Mother prefers football hold to right breast. Baby latched easily, suckles and swallows but falls sleep sliding off nipple. Assisted mother with positioning for comfort and to attain a deeper latch.   Baby started showing cues and offered to assist with latch again. Colostrum easily expressed. Identified some swallows but baby quickly fell asleep in the breast after 5 minutes. Mother requested a DEBP kit to take home and it was provided. Encouraged to contact [facility] for support and questions.   Maternal Data Formula Feeding for Exclusion: No Has patient been taught Hand Expression?: Yes  Feeding Feeding Type: Breast Fed  LATCH Score Latch: Repeated attempts needed to sustain latch, nipple held in mouth throughout feeding, stimulation needed to elicit sucking reflex.  Audible Swallowing: A few with stimulation  Type of Nipple: Everted at rest and after stimulation  Comfort (Breast/Nipple): Soft / non-tender  Hold (Positioning): Assistance needed to correctly position infant at breast and maintain latch.  LATCH Score: 7  Interventions Interventions: Assisted with latch;Adjust position;Support pillows;Skin to skin  Lactation Tools Discussed/Used     Consult Status Consult Status: Follow-up Date: 10/01/19 Follow-up type: In-patient    Adriana Lina A Higuera Ancidey 09/30/2019, 5:54 PM

## 2019-09-30 NOTE — Lactation Note (Signed)
This note was copied from a baby's chart. Lactation Consultation Note  Patient Name: Betty Richardson [id number] Date: 09/30/2019 Reason for consult: Initial assessment;Early term 43-38.6wks  P2 mother whose infant is now 46 hours old.  This is an ETI at 37+2 weeks weighing 6-1.4 oz. Per mother, baby was in the nursery under the radiant warmer due to low temperatures.  Mother eating lunch when I arrived and support person asleep on the couch.  Mother was not happy with her lunch and I suggested she call the cafeteria to obtain a replacement lunch.  Mother said it was "okay."  I offered to obtain a snack from our nourishment room and mother declined.  Briefly discussed the ETI and baby's weight.  Encouraged to feed 8-12 times/24 hours or sooner if baby shows feeding cues.  Mother was familiar with cues and wants to review hand expression, however, she would like me to return after she finishes her lunch.  Offered to return when it is convenient for her.  She will call her RN when it is a good time for me to visit.    Mom made aware of O/P services, breastfeeding support groups, community resources, and our phone # for post-discharge questions. Mother has a DEBP for home use.  RN updated.   Maternal Data Formula Feeding for Exclusion: No Has patient been taught Hand Expression?: Yes  Feeding Feeding Type: Breast Fed  LATCH Score Latch: Grasps breast easily, tongue down, lips flanged, rhythmical sucking.  Audible Swallowing: A few with stimulation  Type of Nipple: Everted at rest and after stimulation  Comfort (Breast/Nipple): Soft / non-tender  Hold (Positioning): Assistance needed to correctly position infant at breast and maintain latch.  LATCH Score: 8  Interventions Interventions: Breast feeding basics reviewed;Assisted with latch;Skin to skin;Hand express;Breast compression;Support pillows  Lactation Tools Discussed/Used     Consult Status Consult Status: Follow-up Date:  10/01/19 Follow-up type: In-patient    Ryder Chesmore R Danisa Kopec 09/30/2019, 3:28 PM

## 2019-09-30 NOTE — Anesthesia Postprocedure Evaluation (Signed)
Anesthesia Post Note  Patient: MARGEART ALLENDER  Procedure(s) Performed: CESAREAN SECTION (N/A Abdomen)     Patient location during evaluation: PACU Anesthesia Type: Spinal Level of consciousness: oriented and awake and alert Pain management: pain level controlled Vital Signs Assessment: post-procedure vital signs reviewed and stable Respiratory status: spontaneous breathing, respiratory function stable and nonlabored ventilation Cardiovascular status: blood pressure returned to baseline and stable Postop Assessment: no headache, no backache, no apparent nausea or vomiting and spinal receding Anesthetic complications: no   No complications documented.  Last Vitals:  Vitals:   09/30/19 1130 09/30/19 1145  BP: (!) 102/56 (!) 104/59  Pulse: (!) 101 (!) 103  Resp: (!) 22 17  Temp:    SpO2: 99% 99%    Last Pain:  Vitals:   09/30/19 1145  TempSrc:   PainSc: 0-No pain   Pain Goal:    LLE Motor Response: No movement due to regional block (09/30/19 1145) LLE Sensation: Tingling (09/30/19 1145) RLE Motor Response: No movement due to regional block (09/30/19 1145) RLE Sensation: Tingling (09/30/19 1145)     Epidural/Spinal Function Cutaneous sensation: Tingles (09/30/19 1145), Patient able to flex knees: No (09/30/19 1145), Patient able to lift hips off bed: No (09/30/19 1145), Back pain beyond tenderness at insertion site: No (09/30/19 1145), Progressively worsening motor and/or sensory loss: No (09/30/19 1145), Bowel and/or bladder incontinence post epidural: No (09/30/19 1145)  Theadora Noyes A.

## 2019-09-30 NOTE — Progress Notes (Signed)
Subjective: Postpartum Day 0: Cesarean Delivery Patient reports incisional pain and tolerating PO.  Nl lochia, pain controlled    Objective: Vital signs in last 24 hours: Temp:  [97.7 F (36.5 C)-98.8 F (37.1 C)] 98.1 F (36.7 C) (07/12 2010) Pulse Rate:  [85-109] 96 (07/12 2010) Resp:  [14-22] 18 (07/12 2010) BP: (97-136)/(53-88) 109/55 (07/12 2010) SpO2:  [98 %-100 %] 98 % (07/12 2010) Weight:  [99.3 kg] 99.3 kg (07/12 0832)  Physical Exam:  General: alert and no distress Lochia: appropriate Uterine Fundus: firm Incision: bandage intact, some bleeding will have nurse replace DVT Evaluation: No evidence of DVT seen on physical exam.  Recent Labs    09/28/19 0934 09/30/19 0813  HGB 12.5 12.2  HCT 37.7 37.3    Assessment/Plan: Status post Cesarean section. Doing well postoperatively.  Continue current care.  Genova Kiner Bovard-Stuckert 09/30/2019, 10:00 PM

## 2019-09-30 NOTE — Transfer of Care (Signed)
Immediate Anesthesia Transfer of Care Note  Patient: Betty Richardson  Procedure(s) Performed: CESAREAN SECTION (N/A Abdomen)  Patient Location: PACU  Anesthesia Type:Spinal  Level of Consciousness: awake, alert  and oriented  Airway & Oxygen Therapy: Patient Spontanous Breathing  Post-op Assessment: Report given to RN and Post -op Vital signs reviewed and stable  Post vital signs: Reviewed and stable  Last Vitals:  Vitals Value Taken Time  BP 97/53 09/30/19 1120  Temp    Pulse 93 09/30/19 1122  Resp 17 09/30/19 1122  SpO2 98 % 09/30/19 1122  Vitals shown include unvalidated device data.  Last Pain:  Vitals:   09/30/19 0832  TempSrc: Oral  PainSc: 0-No pain         Complications: No complications documented.

## 2019-09-30 NOTE — Anesthesia Procedure Notes (Signed)
Spinal  Patient location during procedure: OR Start time: 09/30/2019 10:09 AM End time: 09/30/2019 10:15 AM Staffing Performed: anesthesiologist  Anesthesiologist: Mal Amabile, MD Preanesthetic Checklist Completed: patient identified, IV checked, site marked, risks and benefits discussed, surgical consent, monitors and equipment checked, pre-op evaluation and timeout performed Spinal Block Patient position: sitting Prep: DuraPrep and site prepped and draped Patient monitoring: heart rate, cardiac monitor, continuous pulse ox and blood pressure Approach: midline Location: L3-4 Injection technique: single-shot Needle Needle type: Pencan  Needle gauge: 24 G Needle length: 9 cm Needle insertion depth: 7.5 cm Assessment Sensory level: T4 Additional Notes Patient tolerated procedure well. Adequate sensory level. Attempt x 2. Difficult due to poor position.

## 2019-09-30 NOTE — Op Note (Signed)
Operative Note    Preoperative Diagnosis 1. IUP at 37 5/7wks  2. Placenta previa   Postoperative Diagnosis: Same   Procedure: Primary low transverse cesarean section with double layered closure   Surgeon: Britt Bottom DO Assist: Webb Silversmith RNFA  Anesthesia: General Dr. Malen Gauze M.D Sullivan Lone CRNA  Fluids: LR EBL: UOP:   Findings: Viable female infant in vertex lie. APGARS 9,9; weight pending                  Grossly normal uterus, tubes and ovaries   Specimen: Placenta to L/D   Procedure Note Patient was taken to the operating room where spinal anesthesia was administered and found to be adequate.  She was prepped and draped in the normal sterile fashion in the dorsal supine position with a traxi placed and with a leftward tilt. An appropriate time out was performed.Allis clamp test confirmed adequate anesthesia.  A Pfannenstiel skin incision was then made with the scalpel and carried through to the underlying layer of fascia by sharp dissection and Bovie cautery. The fascia was nicked in the midline and the incision was extended laterally with Mayo scissors. The superior, then inferior, aspects of the incision were grasped with Kocher clamps and dissected off the underlying rectus muscles.  Rectus muscles were separated in the midline and the peritoneal cavity entered bluntly. The Alexis self-retaining wound retractor was then placed within the incision and the lower uterine segment exposed. The bladder flap was developed with Metzenbaum scissors and pushed away from the lower uterine segment. The lower uterine segment was then incised in a transverse fashion and the cavity itself entered bluntly. Vertex was easily delivered, loose nuchal reduced over infants head. Shoulders delivered next with body following easily. Clear amniotic fluid was noted. Baby had a vigorous spontaneous cry.  Bulb suction was performed and cord was clamped after a minute delay and cut.   Infant was handed off to NICU team. Cord blood was obtained. The placenta was then spontaneously expressed from the uterus and the uterus cleared of all clots and debris with moist lap sponge. The uterine incision was then repaired in 2 layers the first layer was a running locked layer 1-0 chromic and the second an imbricating layer of the same suture. The tubes and ovaries were inspected and noted to be grossly normal. Hemostasis was appreciated. Next, the gutters were cleared of all clots and debris.  All instruments and sponges as well as the Alexis retractor were then removed from the abdomen. The rectus muscles and peritoneum were then reapproximated using 2-0 Vicryl. The fascia was then closed with 0 Vicryl in a running fashion.  The skin was closed with a subcuticular stitch of 4-0 Vicryl on a Keith needle and then reinforced with benzoin and Steri-Strips. At the conclusion of the procedure all instruments and sponge counts were correct. Patient was taken to the recovery room in good condition with baby  accompanying her skin to skin.

## 2019-09-30 NOTE — Interval H&P Note (Signed)
History and Physical Interval Note: Pt doing well. No change from H/P. Reviewed risks and benefits of c/s as well as expectations intraop. No questions To OR when ready  09/30/2019 9:59 AM  [name] Betty Richardson  has presented today for surgery, with the diagnosis of previa, 37-38 weeks.  The various methods of treatment have been discussed with the patient and family. After consideration of risks, benefits and other options for treatment, the patient has consented to  Procedure(s) with comments: CESAREAN SECTION (N/A) - Heather, RNFA as a surgical intervention.  The patient's history has been reviewed, patient examined, no change in status, stable for surgery.  I have reviewed the patient's chart and labs.  Questions were answered to the patient's satisfaction.     [name]

## 2019-10-01 LAB — CBC
HCT: 31.6 % — ABNORMAL LOW (ref 36.0–46.0)
Hemoglobin: 10.3 g/dL — ABNORMAL LOW (ref 12.0–15.0)
MCH: 28.6 pg (ref 26.0–34.0)
MCHC: 32.6 g/dL (ref 30.0–36.0)
MCV: 87.8 fL (ref 80.0–100.0)
Platelets: 145 10*3/uL — ABNORMAL LOW (ref 150–400)
RBC: 3.6 MIL/uL — ABNORMAL LOW (ref 3.87–5.11)
RDW: 13.7 % (ref 11.5–15.5)
WBC: 19.9 10*3/uL — ABNORMAL HIGH (ref 4.0–10.5)
nRBC: 0 % (ref 0.0–0.2)

## 2019-10-01 MED ORDER — ACETAMINOPHEN 500 MG PO TABS
1000.0000 mg | ORAL_TABLET | Freq: Four times a day (QID) | ORAL | Status: DC | PRN
  Administered 2019-10-01 – 2019-10-02 (×4): 1000 mg via ORAL
  Filled 2019-10-01 (×4): qty 2

## 2019-10-01 NOTE — Progress Notes (Signed)
Subjective: Postpartum Day 1: Cesarean Delivery Patient reports tolerating PO and no problems voiding. A bit more sore this AM   Objective: Vital signs in last 24 hours: Temp:  [97.7 F (36.5 C)-98.2 F (36.8 C)] 98.1 F (36.7 C) (07/13 0021) Pulse Rate:  [85-109] 98 (07/13 0408) Resp:  [14-22] 18 (07/13 0408) BP: (97-126)/(53-87) 125/66 (07/13 0408) SpO2:  [98 %-100 %] 100 % (07/13 0408)  Physical Exam:  General: alert and cooperative Lochia: appropriate Uterine Fundus: firm Incision: C/D/I   Recent Labs    09/30/19 0813 10/01/19 0640  HGB 12.2 10.3*  HCT 37.3 31.6*    Assessment/Plan: Status post Cesarean section. Doing well postoperatively.  Continue current care.  D/w pt not uncommon to be more sore after spinal duramorph wears off and how to take scheduled pain meds.   Circumcision done and post care reviewed.  Oliver Pila 10/01/2019, 9:57 AM

## 2019-10-01 NOTE — Lactation Note (Signed)
This note was copied from a baby's chart. Lactation Consultation Note  Patient Name: Betty Richardson [id number] Date: 10/01/2019 Reason for consult: Follow-up assessment;Difficult latch;Early term 37-38.6wks   Follow-up assessment of 34 hours baby Betty, breastfeed exclusively with 3% weight loss. Mother is breastfeeding baby right breast football hold. Mother explained baby had a circumcision today and slept for 6 hours. Baby seems to be cluster feeding now. Baby seems comfortable and sucking rhythmically, swallowing observed. Mother explained baby has been breastfeeding for more than 20 minutes. Mother explained she wants to pump to finish the feed and FOB can bottle feed baby with expressed breast milk. Mother decided to pump only right breast. Assisted with hand expression on left breast (total of 12 mL) and FOB spoon fed baby 38mL. Mother obtained 91mL via pump. FOB fed 57mL in a bottle, burped and continue with skin to skin for 10 minutes.   Mother stated she has 15 mL of pumped breast milk in refrigerator. Encouraged use it next feeding to supplement baby. Parents verbalized agreement. Parents are aware to call lactation as needed.     Maternal Data Has patient been taught Hand Expression?: Yes Does the patient have breastfeeding experience prior to this delivery?: Yes  Feeding Feeding Type: Bottle Fed - Breast Milk  LATCH Score Latch: Grasps breast easily, tongue down, lips flanged, rhythmical sucking.  Audible Swallowing: Spontaneous and intermittent  Type of Nipple: Everted at rest and after stimulation  Comfort (Breast/Nipple): Soft / non-tender  Hold (Positioning): No assistance needed to correctly position infant at breast.  LATCH Score: 10  Interventions Interventions: Hand express;Adjust position;Expressed milk  Lactation Tools Discussed/Used Tools: Pump;Nipple [name];Bottle Nipple shield size: 20 Breast pump type: Double-Electric Breast Pump   Consult  Status Consult Status: Follow-up Date: 10/02/19 Follow-up type: In-patient    [name] A Higuera Ancidey 10/01/2019, 8:39 PM

## 2019-10-01 NOTE — Progress Notes (Addendum)
   10/01/19 1434  Pain Assessment  Pain Assessment 0-10  Pain Score 10  Pain Location Abdomen  Pain Orientation Lower  Pain Descriptors / Indicators Pressure  Pain Frequency Intermittent  Pain Onset On-going  Pain Intervention(s) Medication (See eMAR)  md notified.  pt emptied bladder, heat packs applied, pt had bowel sounds and uterus is UE. Md ordered 1000 tylenol q6h prn

## 2019-10-02 LAB — BPAM RBC
Blood Product Expiration Date: 202108072359
Blood Product Expiration Date: 202108072359
Unit Type and Rh: 6200
Unit Type and Rh: 6200

## 2019-10-02 LAB — TYPE AND SCREEN
ABO/RH(D): A POS
Antibody Screen: NEGATIVE
Unit division: 0
Unit division: 0

## 2019-10-02 MED ORDER — OXYCODONE HCL 5 MG PO TABS: 5.0000 mg | ORAL_TABLET | ORAL | 0 refills | Status: DC | PRN

## 2019-10-02 MED ORDER — PRENATAL MULTIVITAMIN CH: 1.0000 | ORAL_TABLET | Freq: Every day | ORAL | 3 refills | Status: AC

## 2019-10-02 MED ORDER — IBUPROFEN 800 MG PO TABS: 800.0000 mg | ORAL_TABLET | Freq: Three times a day (TID) | ORAL | 1 refills | Status: DC

## 2019-10-02 NOTE — Progress Notes (Signed)
Subjective: Postpartum Day 2: Cesarean Delivery Patient reports incisional pain and tolerating PO. Nl lochia, pain controlled   Objective: Vital signs in last 24 hours: Temp:  [98 F (36.7 C)-98.4 F (36.9 C)] 98.4 F (36.9 C) (07/14 0538) Pulse Rate:  [82-87] 82 (07/14 0538) Resp:  [16-18] 16 (07/14 0538) BP: (109-119)/(70-75) 109/70 (07/14 0538) SpO2:  [100 %] 100 % (07/14 0538)  Physical Exam:  General: alert and no distress Lochia: appropriate Uterine Fundus: firm Incision: healing well DVT Evaluation: No evidence of DVT seen on physical exam.  Recent Labs    09/30/19 0813 10/01/19 0640  HGB 12.2 10.3*  HCT 37.3 31.6*    Assessment/Plan: Status post Cesarean section. Doing well postoperatively.  Discharge home with standard precautions and return to clinic in 2 weeks.  Tolerating blood loss anemia.  Desires d/c to home.  Will d/c if baby able to go home.  Will d/c with motrin and oxycodone.    Sanjuana Mruk Bovard-Stuckert 10/02/2019, 8:12 AM

## 2019-10-02 NOTE — Discharge Summary (Signed)
Postpartum Discharge Summary     Patient Name: Betty Richardson DOB: 1995-10-21 MRN: [id number]  Date of admission: 09/30/2019 Delivery date:09/30/2019  Delivering provider: Sherlyn Hay  Date of discharge: 10/02/2019  Admitting diagnosis: Placenta previa antepartum in third trimester [O44.03] Intrauterine pregnancy: [redacted]w[redacted]d    Secondary diagnosis:  Active Problems:   Placenta previa antepartum in third trimester   S/P cesarean section  Additional problems: blood loss anemia    Discharge diagnosis: Term Pregnancy Delivered                                              Post partum procedures:N/A Augmentation: N/A Complications: None  Hospital course: Sceduled C/S   24y.o. yo G2P1001 at 371w2das admitted to the hospital 09/30/2019 for scheduled cesarean section with the following indication:placenta previa.Delivery details are as follows:  Membrane Rupture Time/Date: 10:38 AM ,09/30/2019   Delivery Method:C-Section, Low Transverse  Details of operation can be found in separate operative note.  Patient had an uncomplicated postpartum course.  She is ambulating, tolerating a regular diet, passing flatus, and urinating well. Patient is discharged home in stable condition on  10/02/19        Newborn Data: Birth date:09/30/2019  Birth time:10:39 AM  Gender:Female  Living status:Living  Apgars:9 ,9  Weight:2761 g     Magnesium Sulfate received: No BMZ received: No Rhophylac:No MMR:No T-DaP:Given prenatally Flu: No Transfusion:No  Physical exam  Vitals:   10/01/19 0021 10/01/19 0408 10/01/19 2300 10/02/19 0538  BP: (!) 119/53 125/66 119/75 109/70  Pulse: 98 98 87 82  Resp: 18 18 18 16   Temp: 98.1 F (36.7 C)  98 F (36.7 C) 98.4 F (36.9 C)  TempSrc: Oral  Axillary Oral  SpO2: 100% 100% 100% 100%  Weight:      Height:       General: alert and no distress Lochia: appropriate Uterine Fundus: firm Incision: Healing well with no significant drainage DVT  Evaluation: No evidence of DVT seen on physical exam. Labs: Lab Results  Component Value Date   WBC 19.9 (H) 10/01/2019   HGB 10.3 (L) 10/01/2019   HCT 31.6 (L) 10/01/2019   MCV 87.8 10/01/2019   PLT 145 (L) 10/01/2019   No flowsheet data found. Edinburgh Score: Edinburgh Postnatal Depression Scale Screening Tool 10/01/2019  I have been able to laugh and see the funny side of things. 0  I have looked forward with enjoyment to things. 0  I have blamed myself unnecessarily when things went wrong. 0  I have been anxious or worried for no good reason. 0  I have felt scared or panicky for no good reason. 0  Things have been getting on top of me. 0  I have been so unhappy that I have had difficulty sleeping. 0  I have felt sad or miserable. 0  I have been so unhappy that I have been crying. 0  The thought of harming myself has occurred to me. 0  Edinburgh Postnatal Depression Scale Total 0      After visit meds:  Allergies as of 10/02/2019   No Known Allergies     Medication List    STOP taking these medications   lidocaine 2 % solution Commonly known as: XYLOCAINE     TAKE these medications   ibuprofen 800 MG tablet Commonly known as:  ADVIL Take 1 tablet (800 mg total) by mouth every 8 (eight) hours.   oxyCODONE 5 MG immediate release tablet Commonly known as: Oxy IR/ROXICODONE Take 1-2 tablets (5-10 mg total) by mouth every 4 (four) hours as needed for moderate pain.   prenatal multivitamin Tabs tablet Take 1 tablet by mouth daily at 12 noon.        Discharge home in stable condition Infant Feeding: Breast Infant Disposition:home with mother Discharge instruction: per After Visit Summary and Postpartum booklet. Activity: Advance as tolerated. Pelvic rest for 6 weeks.  Diet: routine diet Anticipated Birth Control: Unsure Postpartum Appointment:2 and 6 weeks Additional Postpartum F/U: N/A Future Appointments:No future appointments. Follow up Visit:   Haivana Nakya, [place], DO. Schedule an appointment as soon as possible for a visit in 2 week(s).   Specialty: Obstetrics and Gynecology Why: for incision check (Dr Terri Piedra may be out of the office, but you can see any of [place]) and 6 weeks for full postpartum check Contact information: [address] [place] [address] [phone]                   10/02/2019 Betty Contes, MD

## 2020-03-21 NOTE — L&D Delivery Note (Signed)
Delivery Note After AROM she progressed quickly to complete and pushed well with 3 ctx.  At 12:40 AM a viable female was delivered via Vaginal, Spontaneous (Presentation: Left Occiput Anterior).  APGAR: 8, 9; weight pending.   Placenta status: Spontaneous, Intact.  Cord: 3 vessels with the following complications: None.   Anesthesia: Epidural Episiotomy: None Lacerations:  Right labia-completely through for 1 cm superiorly Suture Repair: 3.0 vicryl rapide Est. Blood Loss (mL):  200  Mom to postpartum.  Baby to Couplet care / Skin to Skin.  They would like him circumcised  Zenaida Niece 11/08/2020, 12:59 AM

## 2020-04-01 DIAGNOSIS — Z3A01 Less than 8 weeks gestation of pregnancy: Secondary | ICD-10-CM | POA: Diagnosis not present

## 2020-04-01 DIAGNOSIS — O3680X0 Pregnancy with inconclusive fetal viability, not applicable or unspecified: Secondary | ICD-10-CM | POA: Diagnosis not present

## 2020-04-01 DIAGNOSIS — N911 Secondary amenorrhea: Secondary | ICD-10-CM | POA: Diagnosis not present

## 2020-05-12 DIAGNOSIS — Z369 Encounter for antenatal screening, unspecified: Secondary | ICD-10-CM | POA: Diagnosis not present

## 2020-05-12 DIAGNOSIS — Z9889 Other specified postprocedural states: Secondary | ICD-10-CM | POA: Diagnosis not present

## 2020-05-12 DIAGNOSIS — Z1151 Encounter for screening for human papillomavirus (HPV): Secondary | ICD-10-CM | POA: Diagnosis not present

## 2020-05-12 DIAGNOSIS — Z3A13 13 weeks gestation of pregnancy: Secondary | ICD-10-CM | POA: Diagnosis not present

## 2020-05-12 DIAGNOSIS — Z113 Encounter for screening for infections with a predominantly sexual mode of transmission: Secondary | ICD-10-CM | POA: Diagnosis not present

## 2020-05-12 DIAGNOSIS — Z124 Encounter for screening for malignant neoplasm of cervix: Secondary | ICD-10-CM | POA: Diagnosis not present

## 2020-05-12 DIAGNOSIS — O26891 Other specified pregnancy related conditions, first trimester: Secondary | ICD-10-CM | POA: Diagnosis not present

## 2020-05-12 DIAGNOSIS — N898 Other specified noninflammatory disorders of vagina: Secondary | ICD-10-CM | POA: Diagnosis not present

## 2020-05-12 DIAGNOSIS — Z3A12 12 weeks gestation of pregnancy: Secondary | ICD-10-CM | POA: Diagnosis not present

## 2020-05-12 LAB — OB RESULTS CONSOLE ANTIBODY SCREEN: Antibody Screen: NEGATIVE

## 2020-05-12 LAB — OB RESULTS CONSOLE ABO/RH: RH Type: POSITIVE

## 2020-05-12 LAB — OB RESULTS CONSOLE GC/CHLAMYDIA
Chlamydia: NEGATIVE
Gonorrhea: NEGATIVE

## 2020-05-12 LAB — OB RESULTS CONSOLE HEPATITIS B SURFACE ANTIGEN: Hepatitis B Surface Ag: NEGATIVE

## 2020-05-12 LAB — OB RESULTS CONSOLE RPR: RPR: NONREACTIVE

## 2020-05-12 LAB — OB RESULTS CONSOLE RUBELLA ANTIBODY, IGM: Rubella: IMMUNE

## 2020-05-12 LAB — OB RESULTS CONSOLE HIV ANTIBODY (ROUTINE TESTING): HIV: NONREACTIVE

## 2020-05-13 DIAGNOSIS — Z124 Encounter for screening for malignant neoplasm of cervix: Secondary | ICD-10-CM | POA: Diagnosis not present

## 2020-05-13 DIAGNOSIS — Z1151 Encounter for screening for human papillomavirus (HPV): Secondary | ICD-10-CM | POA: Diagnosis not present

## 2020-06-12 ENCOUNTER — Telehealth (HOSPITAL_COMMUNITY): Payer: Self-pay | Admitting: *Deleted

## 2020-06-23 DIAGNOSIS — Z3A18 18 weeks gestation of pregnancy: Secondary | ICD-10-CM | POA: Diagnosis not present

## 2020-06-23 DIAGNOSIS — Z363 Encounter for antenatal screening for malformations: Secondary | ICD-10-CM | POA: Diagnosis not present

## 2020-06-23 DIAGNOSIS — O34211 Maternal care for low transverse scar from previous cesarean delivery: Secondary | ICD-10-CM | POA: Diagnosis not present

## 2020-08-25 DIAGNOSIS — Z3A27 27 weeks gestation of pregnancy: Secondary | ICD-10-CM | POA: Diagnosis not present

## 2020-08-25 DIAGNOSIS — Z3689 Encounter for other specified antenatal screening: Secondary | ICD-10-CM | POA: Diagnosis not present

## 2020-08-25 DIAGNOSIS — Z23 Encounter for immunization: Secondary | ICD-10-CM | POA: Diagnosis not present

## 2020-09-25 DIAGNOSIS — Z3482 Encounter for supervision of other normal pregnancy, second trimester: Secondary | ICD-10-CM | POA: Diagnosis not present

## 2020-09-25 DIAGNOSIS — Z3483 Encounter for supervision of other normal pregnancy, third trimester: Secondary | ICD-10-CM | POA: Diagnosis not present

## 2020-10-28 DIAGNOSIS — D6959 Other secondary thrombocytopenia: Secondary | ICD-10-CM | POA: Diagnosis not present

## 2020-10-28 DIAGNOSIS — Z3685 Encounter for antenatal screening for Streptococcus B: Secondary | ICD-10-CM | POA: Diagnosis not present

## 2020-10-28 DIAGNOSIS — Z3A36 36 weeks gestation of pregnancy: Secondary | ICD-10-CM | POA: Diagnosis not present

## 2020-10-28 DIAGNOSIS — O09293 Supervision of pregnancy with other poor reproductive or obstetric history, third trimester: Secondary | ICD-10-CM | POA: Diagnosis not present

## 2020-10-28 LAB — OB RESULTS CONSOLE GBS: GBS: NEGATIVE

## 2020-11-06 ENCOUNTER — Encounter (HOSPITAL_COMMUNITY): Payer: Self-pay

## 2020-11-06 ENCOUNTER — Telehealth (HOSPITAL_COMMUNITY): Payer: Self-pay | Admitting: *Deleted

## 2020-11-06 NOTE — Patient Instructions (Signed)
LAKESHIA DOHNER  11/06/2020   Your procedure is scheduled on:  11/19/2020  Arrive at 0530 at Entrance C on [address] at [facility]  and [employer]. You are invited to use the FREE valet parking or use the Visitor's parking deck.  Pick up the phone at the desk and dial [phone].  Call this number if you have problems the morning of surgery: [phone]  Remember:   Do not eat food:(After Midnight) Desps de medianoche.  Do not drink clear liquids: (After Midnight) Desps de medianoche.  Take these medicines the morning of surgery with A SIP OF WATER:  none   Do not wear jewelry, make-up or nail polish.  Do not wear lotions, powders, or perfumes. Do not wear deodorant.  Do not shave 48 hours prior to surgery.  Do not bring valuables to the hospital.  [facility] is not   responsible for any belongings or valuables brought to the hospital.  Contacts, dentures or bridgework may not be worn into surgery.  Leave suitcase in the car. After surgery it may be brought to your room.  For patients admitted to the hospital, checkout time is 11:00 AM the day of              discharge.      Please read over the following fact sheets that you were given:     Preparing for Surgery

## 2020-11-06 NOTE — Telephone Encounter (Signed)
Preadmission screen  

## 2020-11-07 ENCOUNTER — Other Ambulatory Visit: Payer: Self-pay

## 2020-11-07 ENCOUNTER — Inpatient Hospital Stay (HOSPITAL_COMMUNITY)
Admission: AD | Admit: 2020-11-07 | Discharge: 2020-11-09 | DRG: 806 | Disposition: A | Discharge status: Home / Self Care | Payer: Medicaid Other | Attending: Obstetrics and Gynecology | Admitting: Obstetrics and Gynecology

## 2020-11-07 ENCOUNTER — Inpatient Hospital Stay (HOSPITAL_COMMUNITY): Payer: Medicaid Other | Admitting: Anesthesiology

## 2020-11-07 DIAGNOSIS — Z20822 Contact with and (suspected) exposure to covid-19: Secondary | ICD-10-CM | POA: Diagnosis present

## 2020-11-07 DIAGNOSIS — Z3A38 38 weeks gestation of pregnancy: Secondary | ICD-10-CM | POA: Diagnosis not present

## 2020-11-07 DIAGNOSIS — O34219 Maternal care for unspecified type scar from previous cesarean delivery: Secondary | ICD-10-CM | POA: Diagnosis not present

## 2020-11-07 DIAGNOSIS — D6959 Other secondary thrombocytopenia: Secondary | ICD-10-CM | POA: Diagnosis not present

## 2020-11-07 DIAGNOSIS — O9912 Other diseases of the blood and blood-forming organs and certain disorders involving the immune mechanism complicating childbirth: Secondary | ICD-10-CM | POA: Diagnosis present

## 2020-11-07 DIAGNOSIS — O26893 Other specified pregnancy related conditions, third trimester: Secondary | ICD-10-CM | POA: Diagnosis not present

## 2020-11-07 DIAGNOSIS — O34211 Maternal care for low transverse scar from previous cesarean delivery: Secondary | ICD-10-CM | POA: Diagnosis not present

## 2020-11-07 LAB — CBC
HCT: 39 % (ref 36.0–46.0)
Hemoglobin: 13.1 g/dL (ref 12.0–15.0)
MCH: 28.2 pg (ref 26.0–34.0)
MCHC: 33.6 g/dL (ref 30.0–36.0)
MCV: 84.1 fL (ref 80.0–100.0)
Platelets: 105 10*3/uL — ABNORMAL LOW (ref 150–400)
RBC: 4.64 MIL/uL (ref 3.87–5.11)
RDW: 13.6 % (ref 11.5–15.5)
WBC: 8.7 10*3/uL (ref 4.0–10.5)
nRBC: 0 % (ref 0.0–0.2)

## 2020-11-07 LAB — TYPE AND SCREEN
ABO/RH(D): A POS
Antibody Screen: NEGATIVE

## 2020-11-07 MED ORDER — ACETAMINOPHEN 325 MG PO TABS: 650.0000 mg | ORAL_TABLET | ORAL | Status: DC | PRN

## 2020-11-07 MED ORDER — OXYCODONE-ACETAMINOPHEN 5-325 MG PO TABS: 1.0000 | ORAL_TABLET | ORAL | Status: DC | PRN

## 2020-11-07 MED ORDER — DIPHENHYDRAMINE HCL 50 MG/ML IJ SOLN: 12.5000 mg | INTRAMUSCULAR | Status: DC | PRN

## 2020-11-07 MED ORDER — BUTORPHANOL TARTRATE 1 MG/ML IJ SOLN
1.0000 mg | INTRAMUSCULAR | Status: DC | PRN
  Administered 2020-11-07: 1 mg via INTRAVENOUS
  Filled 2020-11-07: qty 1

## 2020-11-07 MED ORDER — FENTANYL-BUPIVACAINE-NACL 0.5-0.125-0.9 MG/250ML-% EP SOLN
EPIDURAL | Status: DC | PRN
  Administered 2020-11-07: 12 mL/h via EPIDURAL

## 2020-11-07 MED ORDER — SOD CITRATE-CITRIC ACID 500-334 MG/5ML PO SOLN: 30.0000 mL | ORAL | Status: DC | PRN

## 2020-11-07 MED ORDER — FENTANYL-BUPIVACAINE-NACL 0.5-0.125-0.9 MG/250ML-% EP SOLN: 12.0000 mL/h | EPIDURAL | Status: DC | PRN

## 2020-11-07 MED ORDER — LIDOCAINE HCL (PF) 1 % IJ SOLN
INTRAMUSCULAR | Status: DC | PRN
  Administered 2020-11-07: 8 mL via EPIDURAL

## 2020-11-07 MED ORDER — EPHEDRINE 5 MG/ML INJ: 10.0000 mg | INTRAVENOUS | Status: DC | PRN

## 2020-11-07 MED ORDER — PHENYLEPHRINE 40 MCG/ML (10ML) SYRINGE FOR IV PUSH (FOR BLOOD PRESSURE SUPPORT)
80.0000 ug | PREFILLED_SYRINGE | INTRAVENOUS | Status: DC | PRN
  Administered 2020-11-07: 80 ug via INTRAVENOUS

## 2020-11-07 MED ORDER — FENTANYL-BUPIVACAINE-NACL 0.5-0.125-0.9 MG/250ML-% EP SOLN
12.0000 mL/h | EPIDURAL | Status: DC | PRN
  Filled 2020-11-07: qty 250

## 2020-11-07 MED ORDER — ONDANSETRON HCL 4 MG/2ML IJ SOLN: 4.0000 mg | Freq: Four times a day (QID) | INTRAMUSCULAR | Status: DC | PRN

## 2020-11-07 MED ORDER — LIDOCAINE HCL (PF) 1 % IJ SOLN
30.0000 mL | INTRAMUSCULAR | Status: DC | PRN
  Filled 2020-11-07: qty 30

## 2020-11-07 MED ORDER — OXYTOCIN-SODIUM CHLORIDE 30-0.9 UT/500ML-% IV SOLN
2.5000 [IU]/h | INTRAVENOUS | Status: DC
  Filled 2020-11-07: qty 500

## 2020-11-07 MED ORDER — OXYTOCIN BOLUS FROM INFUSION
333.0000 mL | Freq: Once | INTRAVENOUS | Status: AC
  Administered 2020-11-08: 333 mL via INTRAVENOUS

## 2020-11-07 MED ORDER — LACTATED RINGERS IV SOLN: 500.0000 mL | Freq: Once | INTRAVENOUS | Status: DC

## 2020-11-07 MED ORDER — PHENYLEPHRINE 40 MCG/ML (10ML) SYRINGE FOR IV PUSH (FOR BLOOD PRESSURE SUPPORT)
80.0000 ug | PREFILLED_SYRINGE | INTRAVENOUS | Status: DC | PRN
  Filled 2020-11-07: qty 10

## 2020-11-07 MED ORDER — LACTATED RINGERS IV SOLN
500.0000 mL | INTRAVENOUS | Status: DC | PRN
  Administered 2020-11-07: 1000 mL via INTRAVENOUS

## 2020-11-07 MED ORDER — LACTATED RINGERS IV SOLN: INTRAVENOUS | Status: DC

## 2020-11-07 MED ORDER — OXYCODONE-ACETAMINOPHEN 5-325 MG PO TABS: 2.0000 | ORAL_TABLET | ORAL | Status: DC | PRN

## 2020-11-07 NOTE — Anesthesia Procedure Notes (Signed)
Epidural Patient location during procedure: OB Start time: 11/07/2020 10:50 PM End time: 11/07/2020 11:00 PM  Preanesthetic Checklist Completed: patient identified, IV checked, site marked, risks and benefits discussed, surgical consent, monitors and equipment checked, pre-op evaluation and timeout performed  Epidural Patient position: sitting Prep: DuraPrep and site prepped and draped Patient monitoring: continuous pulse ox and blood pressure Approach: midline Location: L3-L4 Injection technique: LOR air  Needle:  Needle type: Tuohy  Needle gauge: 17 G Needle length: 9 cm and 9 Needle insertion depth: 9 cm Catheter type: closed end flexible Catheter size: 19 Gauge Catheter at skin depth: 15 cm Test dose: negative  Assessment Events: blood not aspirated, injection not painful, no injection resistance, no paresthesia and negative IV test

## 2020-11-07 NOTE — Progress Notes (Signed)
Comfortable with epidural Afeg, VSS FHT-110-120, Cat I, ctx q 3-4 min VE-6/80/-1, vtx, AROM clear Monitor progress, anticipate VBAC

## 2020-11-07 NOTE — MAU Note (Addendum)
Contractions started at 9:30 am.  They are 5 min apart now.  No bleeding. No leaking.  Baby moving well.  1.5 cm on 8/18 in office.  Is a scheduled C/S for Sept 1st.

## 2020-11-07 NOTE — H&P (Signed)
Betty Richardson is a 25 y.o. female, G3 P2002, EGA 38+ weeks with EDC 9-2 presenting for ctx.  On eval in MAU, ctx q 2-5 min, VE 4 cm per RN.  PNC complicated by previous LTCS, preceded by SVD, interested in VBAC and has signed office consent.  Also with benign gestational thrombocytopenia, platelets 105k 2 weeks ago.  OB History     Gravida  3   Para  2   Term  1   Preterm  0   AB  0   Living  1      SAB  0   IAB  0   Ectopic  0   Multiple  0   Live Births  1          Past Medical History:  Diagnosis Date   Medical history non-contributory    Past Surgical History:  Procedure Laterality Date   CESAREAN SECTION N/A 09/30/2019   Procedure: CESAREAN SECTION;  Surgeon: Edwinna Areola, DO;  Location: [facility] LD ORS;  Service: Obstetrics;  Laterality: N/A;  Heather, RNFA   NO PAST SURGERIES     Family History: family history includes Heart failure (age of onset: 23) in her maternal grandfather; Heart failure (age of onset: 83) in her maternal grandmother; Seizures in her father. Social History:  reports that she has never smoked. She has never used smokeless tobacco. She reports that she does not drink alcohol and does not use drugs.     Maternal Diabetes: No Genetic Screening: Declined Maternal Ultrasounds/Referrals: Normal Fetal Ultrasounds or other Referrals:  None Maternal Substance Abuse:  No Significant Maternal Medications:  None Significant Maternal Lab Results:  Group B Strep negative Other Comments:  None  Review of Systems  Respiratory: Negative.    Cardiovascular: Negative.   Maternal Medical History:  Reason for admission: Contractions.   Contractions: Frequency: regular.   Perceived severity is moderate.   Fetal activity: Perceived fetal activity is normal.   Prenatal complications: no prenatal complications Prenatal Complications - Diabetes: none.  Dilation: 4.5 Effacement (%): 70 Station: -2 Exam by:: K>Wilson,RN Blood pressure  102/74, pulse (!) 104, temperature 98.1 F (36.7 C), resp. rate 18, SpO2 99 %, unknown if currently breastfeeding. Maternal Exam:  Uterine Assessment: Contraction strength is moderate.  Contraction frequency is irregular.  Abdomen: Patient reports no abdominal tenderness. Surgical scars: low transverse.   Estimated fetal weight is 7.5 lbs.   Fetal presentation: vertex Introitus: Normal vulva. Normal vagina.  Amniotic fluid character: not assessed. Pelvis: adequate for delivery.     Fetal Exam Fetal Monitor Review: Mode: ultrasound.   Baseline rate: 130.  Variability: moderate (6-25 bpm).   Pattern: accelerations present.   Fetal State Assessment: Category I - tracings are normal.  Physical Exam Vitals reviewed.  Constitutional:      Appearance: Normal appearance.  Cardiovascular:     Rate and Rhythm: Normal rate and regular rhythm.  Pulmonary:     Effort: Pulmonary effort is normal. No respiratory distress.  Abdominal:     Palpations: Abdomen is soft.  Genitourinary:    General: Normal vulva.  Neurological:     Mental Status: She is alert.    Prenatal labs: ABO, Rh: A/Positive/-- (02/22 0000) Antibody: Negative (02/22 0000) Rubella: Immune (02/22 0000) RPR: Nonreactive (02/22 0000)  HBsAg: Negative (02/22 0000)  HIV: Non-reactive (02/22 0000)  GBS:   neg  Assessment/Plan: IUP at 38+ weeks, previous SVD and LTCS in labor, benign gestational thrombocytopenia.  She is agreeable  to trying VBAC.  Will admit, check CBC, epidural, then AROM, monitor progress, anticipate VBAC   Leighton Roach Arli Bree 11/07/2020, 8:22 PM

## 2020-11-07 NOTE — Anesthesia Preprocedure Evaluation (Signed)
Anesthesia Evaluation  Patient identified by MRN, date of birth, ID band Patient awake    Reviewed: Allergy & Precautions, H&P , NPO status , Patient's Chart, lab work & pertinent test results, reviewed documented beta blocker date and time   Airway Mallampati: II  TM Distance: >3 FB Neck ROM: full    Dental no notable dental hx.    Pulmonary neg pulmonary ROS,    Pulmonary exam normal breath sounds clear to auscultation       Cardiovascular negative cardio ROS Normal cardiovascular exam Rhythm:regular Rate:Normal     Neuro/Psych negative neurological ROS  negative psych ROS   GI/Hepatic negative GI ROS, Neg liver ROS,   Endo/Other  negative endocrine ROS  Renal/GU negative Renal ROS  negative genitourinary   Musculoskeletal   Abdominal   Peds  Hematology negative hematology ROS (+)   Anesthesia Other Findings   Reproductive/Obstetrics (+) Pregnancy                             Anesthesia Physical Anesthesia Plan  ASA: 3  Anesthesia Plan: Epidural   Post-op Pain Management:    Induction:   PONV Risk Score and Plan:   Airway Management Planned: Natural Airway  Additional Equipment: None  Intra-op Plan:   Post-operative Plan:   Informed Consent: I have reviewed the patients History and Physical, chart, labs and discussed the procedure including the risks, benefits and alternatives for the proposed anesthesia with the patient or authorized representative who has indicated his/her understanding and acceptance.     Dental Advisory Given  Plan Discussed with: Anesthesiologist and CRNA  Anesthesia Plan Comments: (Labs checked- platelets confirmed with RN in room. Fetal heart tracing, per RN, reported to be stable enough for sitting procedure. Discussed epidural, and patient consents to the procedure:  included risk of possible headache,backache, failed block, allergic reaction,  and nerve injury. This patient was asked if she had any questions or concerns before the procedure started.)        Anesthesia Quick Evaluation

## 2020-11-08 ENCOUNTER — Encounter (HOSPITAL_COMMUNITY): Payer: Self-pay | Admitting: Obstetrics and Gynecology

## 2020-11-08 DIAGNOSIS — Z3A38 38 weeks gestation of pregnancy: Secondary | ICD-10-CM | POA: Diagnosis not present

## 2020-11-08 DIAGNOSIS — O34219 Maternal care for unspecified type scar from previous cesarean delivery: Secondary | ICD-10-CM | POA: Diagnosis not present

## 2020-11-08 LAB — CBC
HCT: 33.7 % — ABNORMAL LOW (ref 36.0–46.0)
Hemoglobin: 11.3 g/dL — ABNORMAL LOW (ref 12.0–15.0)
MCH: 28.2 pg (ref 26.0–34.0)
MCHC: 33.5 g/dL (ref 30.0–36.0)
MCV: 84 fL (ref 80.0–100.0)
Platelets: 112 10*3/uL — ABNORMAL LOW (ref 150–400)
RBC: 4.01 MIL/uL (ref 3.87–5.11)
RDW: 13.7 % (ref 11.5–15.5)
WBC: 7.1 10*3/uL (ref 4.0–10.5)
nRBC: 0 % (ref 0.0–0.2)

## 2020-11-08 LAB — RPR: RPR Ser Ql: NONREACTIVE

## 2020-11-08 LAB — RESP PANEL BY RT-PCR (FLU A&B, COVID) ARPGX2
Influenza A by PCR: NEGATIVE
Influenza B by PCR: NEGATIVE
SARS Coronavirus 2 by RT PCR: NEGATIVE

## 2020-11-08 MED ORDER — ZOLPIDEM TARTRATE 5 MG PO TABS: 5.0000 mg | ORAL_TABLET | Freq: Every evening | ORAL | Status: DC | PRN

## 2020-11-08 MED ORDER — OXYCODONE HCL 5 MG PO TABS
10.0000 mg | ORAL_TABLET | ORAL | Status: DC | PRN
Start: 2020-11-08 — End: 2020-11-09

## 2020-11-08 MED ORDER — METHYLERGONOVINE MALEATE 0.2 MG PO TABS
0.2000 mg | ORAL_TABLET | ORAL | Status: DC | PRN
Start: 2020-11-08 — End: 2020-11-09

## 2020-11-08 MED ORDER — METHYLERGONOVINE MALEATE 0.2 MG/ML IJ SOLN
0.2000 mg | INTRAMUSCULAR | Status: DC | PRN
Start: 2020-11-08 — End: 2020-11-09

## 2020-11-08 MED ORDER — BENZOCAINE-MENTHOL 20-0.5 % EX AERO: 1.0000 "application " | INHALATION_SPRAY | CUTANEOUS | Status: DC | PRN

## 2020-11-08 MED ORDER — ONDANSETRON HCL 4 MG/2ML IJ SOLN: 4.0000 mg | INTRAMUSCULAR | Status: DC | PRN

## 2020-11-08 MED ORDER — DIPHENHYDRAMINE HCL 25 MG PO CAPS: 25.0000 mg | ORAL_CAPSULE | Freq: Four times a day (QID) | ORAL | Status: DC | PRN

## 2020-11-08 MED ORDER — OXYCODONE HCL 5 MG PO TABS: 5.0000 mg | ORAL_TABLET | ORAL | Status: DC | PRN

## 2020-11-08 MED ORDER — COCONUT OIL OIL: 1.0000 "application " | TOPICAL_OIL | Status: DC | PRN

## 2020-11-08 MED ORDER — SIMETHICONE 80 MG PO CHEW: 80.0000 mg | CHEWABLE_TABLET | ORAL | Status: DC | PRN

## 2020-11-08 MED ORDER — DIBUCAINE (PERIANAL) 1 % EX OINT: 1.0000 "application " | TOPICAL_OINTMENT | CUTANEOUS | Status: DC | PRN

## 2020-11-08 MED ORDER — MEASLES, MUMPS & RUBELLA VAC IJ SOLR: 0.5000 mL | Freq: Once | INTRAMUSCULAR | Status: DC

## 2020-11-08 MED ORDER — MAGNESIUM HYDROXIDE 400 MG/5ML PO SUSP: 30.0000 mL | ORAL | Status: DC | PRN

## 2020-11-08 MED ORDER — SENNOSIDES-DOCUSATE SODIUM 8.6-50 MG PO TABS
2.0000 | ORAL_TABLET | Freq: Every day | ORAL | Status: DC
  Administered 2020-11-09: 2 via ORAL
  Filled 2020-11-08: qty 2

## 2020-11-08 MED ORDER — WITCH HAZEL-GLYCERIN EX PADS: 1.0000 "application " | MEDICATED_PAD | CUTANEOUS | Status: DC | PRN

## 2020-11-08 MED ORDER — TETANUS-DIPHTH-ACELL PERTUSSIS 5-2.5-18.5 LF-MCG/0.5 IM SUSY: 0.5000 mL | PREFILLED_SYRINGE | Freq: Once | INTRAMUSCULAR | Status: DC

## 2020-11-08 MED ORDER — PRENATAL MULTIVITAMIN CH
1.0000 | ORAL_TABLET | Freq: Every day | ORAL | Status: DC
  Administered 2020-11-08 – 2020-11-09 (×2): 1 via ORAL
  Filled 2020-11-08 (×2): qty 1

## 2020-11-08 MED ORDER — ACETAMINOPHEN 325 MG PO TABS: 650.0000 mg | ORAL_TABLET | ORAL | Status: DC | PRN

## 2020-11-08 MED ORDER — IBUPROFEN 600 MG PO TABS
600.0000 mg | ORAL_TABLET | Freq: Four times a day (QID) | ORAL | Status: DC
  Administered 2020-11-08 – 2020-11-09 (×6): 600 mg via ORAL
  Filled 2020-11-08 (×7): qty 1

## 2020-11-08 MED ORDER — ONDANSETRON HCL 4 MG PO TABS: 4.0000 mg | ORAL_TABLET | ORAL | Status: DC | PRN

## 2020-11-08 NOTE — Progress Notes (Signed)
PPD #0 No problems Afeb, VSS Fundus firm, NT at U-1 Continue routine postpartum care 

## 2020-11-08 NOTE — Care Management (Signed)
Patient was sent to [facility] with epidural cath still in. Made nurse aware that CBC was pending and that once resulted to give L&D a call for removal.

## 2020-11-08 NOTE — Lactation Note (Signed)
This note was copied from a baby's chart. Lactation Consultation Note  Patient Name: Betty Richardson Date: 11/08/2020   Age:26 hours   Per RN, mother declined lactation services.   Maternal Data    Feeding    LATCH Score                    Lactation Tools Discussed/Used    Interventions    Discharge    Consult Status      Lori Popowski R Alexica Schlossberg 11/08/2020, 7:58 AM

## 2020-11-08 NOTE — Lactation Note (Signed)
This note was copied from a baby's chart. Lactation Consultation Note  Patient Name: Betty Richardson NIOEV'O Date: 11/08/2020   Age:26 hours Mom declined LC services in L&D at this time.  Maternal Data    Feeding    LATCH Score                    Lactation Tools Discussed/Used    Interventions    Discharge    Consult Status      Danelle Earthly 11/08/2020, 2:23 AM

## 2020-11-08 NOTE — Anesthesia Postprocedure Evaluation (Signed)
Anesthesia Post Note  Patient: ELIZZIE WESTERGARD  Procedure(s) Performed: AN AD HOC LABOR EPIDURAL     Patient location during evaluation: Mother Baby Anesthesia Type: Epidural Level of consciousness: awake and alert Pain management: pain level controlled Vital Signs Assessment: post-procedure vital signs reviewed and stable Respiratory status: spontaneous breathing, nonlabored ventilation and respiratory function stable Cardiovascular status: stable Postop Assessment: no headache, no backache and epidural receding Anesthetic complications: no   No notable events documented.  Last Vitals:  Vitals:   11/08/20 0250 11/08/20 0353  BP: 112/72 118/79  Pulse: 94 92  Resp: 18 16  Temp: 36.9 C 36.7 C  SpO2: 100% 100%    Last Pain:  Vitals:   11/08/20 0623  TempSrc:   PainSc: 0-No pain   Pain Goal: Patients Stated Pain Goal: 0 (11/07/20 2154)              Epidural/Spinal Function Cutaneous sensation: Normal sensation (11/08/20 8502), Patient able to flex knees: Yes (11/08/20 7741), Patient able to lift hips off bed: Yes (11/08/20 2878), Back pain beyond tenderness at insertion site: No (11/08/20 6767), Progressively worsening motor and/or sensory loss: No (11/08/20 2094), Bowel and/or bladder incontinence post epidural: No (11/08/20 7096)  Haziel Molner

## 2020-11-09 MED ORDER — IBUPROFEN 800 MG PO TABS: 800.0000 mg | ORAL_TABLET | Freq: Three times a day (TID) | ORAL | 1 refills | Status: AC | PRN

## 2020-11-09 NOTE — Progress Notes (Signed)
POSTPARTUM PROGRESS NOTE  Post Partum Day #1  Subjective:  No acute events overnight.  Pt denies problems with ambulating, voiding or po intake.  She denies nausea or vomiting.  Pain is well controlled.  Lochia Minimal.   Objective: Blood pressure 111/72, pulse 69, temperature 97.7 F (36.5 C), temperature source Oral, resp. rate 18, SpO2 100 %, unknown if currently breastfeeding.  Physical Exam:  General: alert, cooperative and no distress Lochia:normal flow Chest: CTAB Heart: RRR no m/r/g Abdomen: +BS, soft, nontender Uterine Fundus: firm, 2cm below umbilicus GU: suture intact, healing well, no purulent drainage Extremities: neg edema, neg calf TTP BL, neg Homans BL  Recent Labs    11/07/20 2020 11/08/20 0154  HGB 13.1 11.3*  HCT 39.0 33.7*    Assessment/Plan:  ASSESSMENT: Betty Richardson is a 25 y.o. [id number] s/p VBAC @ [redacted]w[redacted]d. PNC c/b gestational TCP, h/o csx x1.   Discharge home Baby boy s/p circ   LOS: 2 days

## 2020-11-09 NOTE — Discharge Summary (Signed)
Postpartum Discharge Summary  Date of Service updated     Patient Name: Betty Richardson DOB: 1995/12/06 MRN: [id number]  Date of admission: 11/07/2020 Delivery date:11/08/2020  Delivering provider: Jackelyn Knife, TODD  Date of discharge: 11/09/2020  Admitting diagnosis: Indication for care in labor or delivery [O75.9] Intrauterine pregnancy: [redacted]w[redacted]d     Secondary diagnosis:  Active Problems:   Indication for care in labor or delivery   VBAC, delivered  Additional problems: Gestational thrombocytopenia    Discharge diagnosis: Term Pregnancy Delivered and VBAC                                              Post partum procedures: none Augmentation: AROM Complications: None  Hospital course: Onset of Labor With Vaginal Delivery      25 y.o. yo [id number] at [redacted]w[redacted]d was admitted in Active Labor on 11/07/2020. Patient had an uncomplicated labor course as follows:  Membrane Rupture Time/Date: 11:42 PM ,11/07/2020   Delivery Method:Vaginal, Spontaneous  Episiotomy: None  Lacerations:  Labial  Patient had an uncomplicated postpartum course.  She is ambulating, tolerating a regular diet, passing flatus, and urinating well. Patient is discharged home in stable condition on 11/09/20.  Newborn Data: Birth date:11/08/2020  Birth time:12:40 AM  Gender:Female  Living status:Living  Apgars:8 ,9  Weight:2985 g   Physical exam  Vitals:   11/08/20 1136 11/08/20 1653 11/08/20 2206 11/09/20 0630  BP: 131/63 109/64 116/70 111/72  Pulse: 93 87 72 69  Resp: 18 18 18 18   Temp: 98.4 F (36.9 C) 98.4 F (36.9 C) 98 F (36.7 C) 97.7 F (36.5 C)  TempSrc: Oral Oral Oral Oral  SpO2: 100% 100% 100% 100%   General: alert, cooperative, and no distress Lochia: appropriate Uterine Fundus: firm Incision: N/A DVT Evaluation: No evidence of DVT seen on physical exam. Negative Homan's sign. No cords or calf tenderness. Labs: Lab Results  Component Value Date   WBC 7.1 11/08/2020   HGB 11.3 (L)  11/08/2020   HCT 33.7 (L) 11/08/2020   MCV 84.0 11/08/2020   PLT 112 (L) 11/08/2020   No flowsheet data found. Edinburgh Score: Edinburgh Postnatal Depression Scale Screening Tool 11/08/2020  I have been able to laugh and see the funny side of things. 0  I have looked forward with enjoyment to things. 0  I have blamed myself unnecessarily when things went wrong. 0  I have been anxious or worried for no good reason. 0  I have felt scared or panicky for no good reason. 0  Things have been getting on top of me. 0  I have been so unhappy that I have had difficulty sleeping. 0  I have felt sad or miserable. 0  I have been so unhappy that I have been crying. 0  The thought of harming myself has occurred to me. 0  Edinburgh Postnatal Depression Scale Total 0      After visit meds:  Allergies as of 11/09/2020   No Known Allergies      Medication List     STOP taking these medications    oxyCODONE 5 MG immediate release tablet Commonly known as: Oxy IR/ROXICODONE       TAKE these medications    ibuprofen 800 MG tablet Commonly known as: ADVIL Take 1 tablet (800 mg total) by mouth every 8 (eight) hours as needed. What changed:  when to take this reasons to take this   prenatal multivitamin Tabs tablet Take 1 tablet by mouth daily at 12 noon.         Discharge home in stable condition Infant Feeding: Bottle and Breast Infant Disposition:home with mother Discharge instruction: per After Visit Summary and Postpartum booklet. Activity: Advance as tolerated. Pelvic rest for 6 weeks.  Diet: routine diet Anticipated Birth Control:  Nexplanon vs IUD Postpartum Appointment:6 weeks    11/09/2020 Carlisle Cater, MD

## 2020-11-17 ENCOUNTER — Encounter (HOSPITAL_COMMUNITY)
Admission: RE | Admit: 2020-11-17 | Discharge: 2020-11-17 | Disposition: A | Discharge status: Home / Self Care | Payer: Medicaid Other | Source: Ambulatory Visit | Attending: Obstetrics and Gynecology | Admitting: Obstetrics and Gynecology

## 2020-11-18 ENCOUNTER — Telehealth (HOSPITAL_COMMUNITY): Payer: Self-pay | Admitting: *Deleted

## 2020-11-18 NOTE — Telephone Encounter (Signed)
Attempted discharge follow-up call. No answer received. Deforest Hoyles, RN, 11/18/20, [phone].

## 2020-11-19 ENCOUNTER — Inpatient Hospital Stay (HOSPITAL_COMMUNITY): Admit: 2020-11-19 | Payer: Medicaid Other | Admitting: Obstetrics and Gynecology

## 2020-11-19 ENCOUNTER — Encounter (HOSPITAL_COMMUNITY): Payer: Self-pay

## 2020-11-19 SURGERY — Surgical Case
Anesthesia: Spinal

## 2020-12-22 DIAGNOSIS — Z30017 Encounter for initial prescription of implantable subdermal contraceptive: Secondary | ICD-10-CM | POA: Diagnosis not present

## 2021-07-12 ENCOUNTER — Ambulatory Visit
Admission: EM | Admit: 2021-07-12 | Discharge: 2021-07-12 | Disposition: A | Discharge status: Home / Self Care | Payer: Medicaid Other

## 2021-07-12 DIAGNOSIS — J069 Acute upper respiratory infection, unspecified: Secondary | ICD-10-CM | POA: Diagnosis not present

## 2021-07-12 DIAGNOSIS — R0981 Nasal congestion: Secondary | ICD-10-CM | POA: Diagnosis not present

## 2021-07-12 MED ORDER — LEVOCETIRIZINE DIHYDROCHLORIDE 5 MG PO TABS: 5.0000 mg | ORAL_TABLET | Freq: Every evening | ORAL | 0 refills | Status: DC

## 2021-07-12 MED ORDER — PSEUDOEPHEDRINE HCL 30 MG PO TABS: 30.0000 mg | ORAL_TABLET | Freq: Three times a day (TID) | ORAL | 0 refills | Status: DC | PRN

## 2021-07-12 NOTE — ED Triage Notes (Signed)
4 day h/o bilateral eye itching, congestion, cough and itchy throat. ?Notes decreased appetite and one day of loss of taste. ?Has been taking Claritin with temporary relief. Pt concerned for allergy management. ?

## 2021-07-12 NOTE — ED Provider Notes (Signed)
?[place] ? ? ?MRN: [id number] DOB: Jan 10, 1996 ? ?Subjective:  ? ?Betty Richardson is a 26 y.o. female presenting for 4-day history of acute onset persistent sinus congestion, coughing, scratchy throat.  Patient feels like she has decreased appetite and lost her sense of taste for a day but has recovered.  Has had a longstanding history of allergic rhinitis and has always taking Claritin for this.  Would like recommendations on how to manage her allergies better.  She does not smoke but vapes daily.  No history of asthma.  Multiple sick contacts at home with her children. ? ?No current facility-administered medications for this encounter. ? ?Current Outpatient Medications:  ?  etonogestrel (NEXPLANON) 68 MG IMPL implant, 1 each by Subdermal route once., Disp: , Rfl:  ?  ibuprofen (ADVIL) 800 MG tablet, Take 1 tablet (800 mg total) by mouth every 8 (eight) hours as needed., Disp: 60 tablet, Rfl: 1 ?  Prenatal Vit-Fe Fumarate-FA (PRENATAL MULTIVITAMIN) TABS tablet, Take 1 tablet by mouth daily at 12 noon., Disp: 100 tablet, Rfl: 3  ? ?No Known Allergies ? ?Past Medical History:  ?Diagnosis Date  ? Medical history non-contributory   ?  ? ?Past Surgical History:  ?Procedure Laterality Date  ? CESAREAN SECTION N/A 09/30/2019  ? Procedure: CESAREAN SECTION;  Surgeon: Sherlyn Hay, DO;  Location: [facility] LD ORS;  Service: Obstetrics;  Laterality: N/ANira Conn, RNFA  ? NO PAST SURGERIES    ? ? ?Family History  ?Problem Relation Age of Onset  ? Heart failure Maternal Grandmother 70  ?     type 2  ? Heart failure Maternal Grandfather 60  ?     type2  ? Seizures Father   ? ? ?Social History  ? ?Tobacco Use  ? Smoking status: Never  ? Smokeless tobacco: Never  ?Vaping Use  ? Vaping Use: Never used  ?Substance Use Topics  ? Alcohol use: No  ? Drug use: No  ? ? ?ROS ? ? ?Objective:  ? ?Vitals: ?BP 125/78 (BP Location: Left Arm)   Pulse 69   Temp 98.2 ?F (36.8 ?C) (Oral)   Resp 18   SpO2 99%    Breastfeeding No  ? ?Physical Exam ?Constitutional:   ?   General: She is not in acute distress. ?   Appearance: Normal appearance. She is well-developed and normal weight. She is not ill-appearing, toxic-appearing or diaphoretic.  ?HENT:  ?   Head: Normocephalic and atraumatic.  ?   Right Ear: Tympanic membrane, ear canal and external ear normal. No drainage or tenderness. No middle ear effusion. There is no impacted cerumen. Tympanic membrane is not erythematous.  ?   Left Ear: Tympanic membrane, ear canal and external ear normal. No drainage or tenderness.  No middle ear effusion. There is no impacted cerumen. Tympanic membrane is not erythematous.  ?   Nose: Nose normal. No congestion or rhinorrhea.  ?   Mouth/Throat:  ?   Mouth: Mucous membranes are moist. No oral lesions.  ?   Pharynx: No pharyngeal swelling, oropharyngeal exudate, posterior oropharyngeal erythema or uvula swelling.  ?   Tonsils: No tonsillar exudate or tonsillar abscesses.  ?   Comments: [place] postnasal drainage overlying pharynx. ?Eyes:  ?   General: No scleral icterus.    ?   Right eye: No discharge.     ?   Left eye: No discharge.  ?   Extraocular Movements: Extraocular movements intact.  ?   Right eye: Normal  extraocular motion.  ?   Left eye: Normal extraocular motion.  ?   Conjunctiva/sclera: Conjunctivae normal.  ?Cardiovascular:  ?   Rate and Rhythm: Normal rate.  ?   Heart sounds: No murmur heard. ?  No friction rub. No gallop.  ?Pulmonary:  ?   Effort: Pulmonary effort is normal. No respiratory distress.  ?   Breath sounds: No stridor. No wheezing, rhonchi or rales.  ?Chest:  ?   Chest wall: No tenderness.  ?Musculoskeletal:  ?   Cervical back: Normal range of motion and neck supple.  ?Lymphadenopathy:  ?   Cervical: No cervical adenopathy.  ?Skin: ?   General: Skin is warm and dry.  ?Neurological:  ?   General: No focal deficit present.  ?   Mental Status: She is alert and oriented to person, place, and time.  ?Psychiatric:      ?   Mood and Affect: Mood normal.     ?   Behavior: Behavior normal.  ? ? ?Assessment and Plan :  ? ?PDMP not reviewed this encounter. ? ?1. Viral upper respiratory infection   ?2. Sinus congestion   ? ?Patient declined COVID test.  I am in agreement. Deferred imaging given clear cardiopulmonary exam, hemodynamically stable vital signs. Suspect viral URI, viral syndrome. Physical exam findings reassuring and vital signs stable for discharge. Advised supportive care, offered symptomatic relief. Counseled patient on potential for adverse effects with medications prescribed/recommended today, ER and return-to-clinic precautions discussed, patient verbalized understanding.  ? ?  ?Jaynee Eagles, PA-C ?07/12/21 1057 ? ?

## 2021-07-30 ENCOUNTER — Telehealth: Payer: Self-pay

## 2021-07-30 NOTE — Telephone Encounter (Signed)
Copied from CRM [id number]. Topic: Appointment Scheduling - Scheduling Inquiry for Clinic ?>> Jul 30, 2021  9:37 AM Randol Kern wrote: ?Reason for CRM: Pt called requesting to have a TB test for her job, she says she cannot return to work on Monday without completing this.  ? ?Best contact: [phone] ? ? ? ? ?I did let the pec know that we do TB test on Mondays, Tuesday and Wednesday and if she need it sooner she could go to the health department today to see if she can get in with them. ?

## 2021-07-30 NOTE — Telephone Encounter (Signed)
Called pt left VM to call back. ? ?[facility] nurse may give results to patient if they return call to clinic, a CRM has been created. ? ?KP

## 2021-08-02 NOTE — Telephone Encounter (Signed)
Patient called, left VM to return the call to the office re: TB test ?

## 2021-10-20 DIAGNOSIS — H5213 Myopia, bilateral: Secondary | ICD-10-CM | POA: Diagnosis not present

## 2021-10-27 ENCOUNTER — Ambulatory Visit: Admission: EM | Admit: 2021-10-27 | Discharge: 2021-10-27 | Payer: Medicaid Other

## 2021-10-27 ENCOUNTER — Other Ambulatory Visit: Payer: Self-pay

## 2021-10-27 ENCOUNTER — Emergency Department (HOSPITAL_BASED_OUTPATIENT_CLINIC_OR_DEPARTMENT_OTHER): Payer: Medicaid Other

## 2021-10-27 ENCOUNTER — Encounter (HOSPITAL_BASED_OUTPATIENT_CLINIC_OR_DEPARTMENT_OTHER): Payer: Self-pay | Admitting: Emergency Medicine

## 2021-10-27 ENCOUNTER — Emergency Department (HOSPITAL_BASED_OUTPATIENT_CLINIC_OR_DEPARTMENT_OTHER)
Admission: EM | Admit: 2021-10-27 | Discharge: 2021-10-27 | Disposition: A | Discharge status: Home / Self Care | Payer: Medicaid Other | Attending: Emergency Medicine | Admitting: Emergency Medicine

## 2021-10-27 DIAGNOSIS — R1013 Epigastric pain: Secondary | ICD-10-CM

## 2021-10-27 DIAGNOSIS — R112 Nausea with vomiting, unspecified: Secondary | ICD-10-CM | POA: Insufficient documentation

## 2021-10-27 DIAGNOSIS — D72829 Elevated white blood cell count, unspecified: Secondary | ICD-10-CM | POA: Insufficient documentation

## 2021-10-27 LAB — COMPREHENSIVE METABOLIC PANEL
ALT: 8 U/L (ref 0–44)
AST: 10 U/L — ABNORMAL LOW (ref 15–41)
Albumin: 4.6 g/dL (ref 3.5–5.0)
Alkaline Phosphatase: 37 U/L — ABNORMAL LOW (ref 38–126)
Anion gap: 9 (ref 5–15)
BUN: 8 mg/dL (ref 6–20)
CO2: 25 mmol/L (ref 22–32)
Calcium: 9.4 mg/dL (ref 8.9–10.3)
Chloride: 105 mmol/L (ref 98–111)
Creatinine, Ser: 0.82 mg/dL (ref 0.44–1.00)
GFR, Estimated: >60 mL/min (ref >60)
Glucose, Bld: 90 mg/dL (ref 70–99)
Potassium: 4.2 mmol/L (ref 3.5–5.1)
Sodium: 139 mmol/L (ref 135–145)
Total Bilirubin: 0.4 mg/dL (ref 0.3–1.2)
Total Protein: 7.3 g/dL (ref 6.5–8.1)

## 2021-10-27 LAB — URINALYSIS, ROUTINE W REFLEX MICROSCOPIC
Bilirubin Urine: NEGATIVE
Glucose, UA: NEGATIVE mg/dL
Hgb urine dipstick: NEGATIVE
Ketones, ur: NEGATIVE mg/dL
Nitrite: NEGATIVE
Protein, ur: 30 mg/dL — AB
Specific Gravity, Urine: 1.032 — ABNORMAL HIGH (ref 1.005–1.030)
pH: 6.5 (ref 5.0–8.0)

## 2021-10-27 LAB — PREGNANCY, URINE: Preg Test, Ur: NEGATIVE

## 2021-10-27 LAB — CBC
HCT: 45.3 % (ref 36.0–46.0)
Hemoglobin: 15.4 g/dL — ABNORMAL HIGH (ref 12.0–15.0)
MCH: 28.9 pg (ref 26.0–34.0)
MCHC: 34 g/dL (ref 30.0–36.0)
MCV: 85 fL (ref 80.0–100.0)
Platelets: 124 10*3/uL — ABNORMAL LOW (ref 150–400)
RBC: 5.33 MIL/uL — ABNORMAL HIGH (ref 3.87–5.11)
RDW: 13.2 % (ref 11.5–15.5)
WBC: 6.6 10*3/uL (ref 4.0–10.5)
nRBC: 0 % (ref 0.0–0.2)

## 2021-10-27 LAB — LIPASE, BLOOD: Lipase: <10 U/L — ABNORMAL LOW (ref 11–51)

## 2021-10-27 MED ORDER — MORPHINE SULFATE (PF) 4 MG/ML IV SOLN
4.0000 mg | Freq: Once | INTRAVENOUS | Status: AC
  Administered 2021-10-27: 4 mg via INTRAVENOUS
  Filled 2021-10-27: qty 1

## 2021-10-27 MED ORDER — PANTOPRAZOLE SODIUM 20 MG PO TBEC: 20.0000 mg | DELAYED_RELEASE_TABLET | Freq: Every day | ORAL | 0 refills | Status: AC

## 2021-10-27 MED ORDER — ALUM & MAG HYDROXIDE-SIMETH 200-200-20 MG/5ML PO SUSP
30.0000 mL | Freq: Once | ORAL | Status: AC
  Administered 2021-10-27: 30 mL via ORAL
  Filled 2021-10-27: qty 30

## 2021-10-27 MED ORDER — IOHEXOL 300 MG/ML  SOLN
100.0000 mL | Freq: Once | INTRAMUSCULAR | Status: AC | PRN
Start: 2021-10-27 — End: 2021-10-27
  Administered 2021-10-27: 100 mL via INTRAVENOUS

## 2021-10-27 MED ORDER — ONDANSETRON HCL 4 MG/2ML IJ SOLN
4.0000 mg | Freq: Once | INTRAMUSCULAR | Status: AC
  Administered 2021-10-27: 4 mg via INTRAVENOUS
  Filled 2021-10-27: qty 2

## 2021-10-27 MED ORDER — MORPHINE SULFATE (PF) 4 MG/ML IV SOLN
4.0000 mg | Freq: Once | INTRAVENOUS | Status: DC
  Filled 2021-10-27: qty 1

## 2021-10-27 NOTE — ED Notes (Signed)
Patient is being discharged from the Urgent Care and sent to the Emergency Department via personal vehicle . Per Provider [facility], patient is in need of higher level of care due to abdominal pain. Patient is aware and verbalizes understanding of plan of care.   Vitals:   10/27/21 1053  BP: 130/80  Pulse: 86  Resp: 18  Temp: 98.1 F (36.7 C)  SpO2: 95%

## 2021-10-27 NOTE — ED Triage Notes (Signed)
Pt presents with epigastric abdominal pain and back pain X 3 days with no known injury or heavy lifting.

## 2021-10-27 NOTE — ED Notes (Signed)
Patient transported to CT 

## 2021-10-27 NOTE — Discharge Instructions (Addendum)
You have been evaluated for your symptoms.  Your discomfort is likely due to heartburn.  Take Prilosec daily for the next several weeks for symptom control.  Avoid heavy alcohol use as it will negatively affect your health.  Incidentally there is a 4 cm ovarian cyst on the right side of your ovary.  This is likely not the cause of your symptoms.  You may discuss this with your OB/GYN.  Return to the ER if you have any concerns.

## 2021-10-27 NOTE — ED Triage Notes (Signed)
Pt here from home with c/o abd pain along with some n/v and back pain no urinary symptoms

## 2021-10-27 NOTE — ED Provider Notes (Signed)
[facility] EMERGENCY DEPT Provider Note   CSN: [id number] Arrival date & time: 10/27/21  1132     History  No chief complaint on file.   Betty Richardson is a 26 y.o. female.  The history is provided by the patient and medical records. No language interpreter was used.     Betty Richardson is a 26 yo F w/o pertinent medical hx presenting to clinic for 4 days of upper back pain and epigastric abdominal pain. She cannot remember which pain occurred first but endorses the back and abd pain occurred suddenly. She says she's had some nausea and vomited 1x yesterday. She has been feeling bloated and feeling like she can't pass gas. Last BM was this morning. Certain positions helps alleviate the pain. Moving aggravates the pain. She has taken OTC ibuprofen w/o success. She denies dysuria, hematuria, blood in stool, abn/bleeding vaginal discharge. Denies doing extraneous exercise, chest pain, SOB, fevers, chills, sick contacts.  Home Medications Prior to Admission medications   Medication Sig Start Date End Date Taking? Authorizing Provider  etonogestrel (NEXPLANON) 68 MG IMPL implant 1 each by Subdermal route once.    [provider]  ibuprofen (ADVIL) 800 MG tablet Take 1 tablet (800 mg total) by mouth every 8 (eight) hours as needed. 11/09/20   Shivaji, Valerie Roys, MD  levocetirizine (XYZAL) 5 MG tablet Take 1 tablet (5 mg total) by mouth every evening. 07/12/21   Wallis Bamberg, PA-C  Prenatal Vit-Fe Fumarate-FA (PRENATAL MULTIVITAMIN) TABS tablet Take 1 tablet by mouth daily at 12 noon. 10/02/19   Bovard-Stuckert, Augusto Gamble, MD  pseudoephedrine (SUDAFED) 30 MG tablet Take 1 tablet (30 mg total) by mouth every 8 (eight) hours as needed for congestion. 07/12/21   Wallis Bamberg, PA-C      Allergies    Patient has no known allergies.    Review of Systems   Review of Systems  All other systems reviewed and are negative.   Physical Exam Updated Vital Signs BP 129/81   Pulse 61   Temp  98.8 F (37.1 C)   Resp 16   Ht 5\' 7"  (1.702 m)   Wt 99.3 kg   LMP 09/19/2021   SpO2 100%   BMI 34.30 kg/m  Physical Exam Vitals and nursing note reviewed.  Constitutional:      General: She is not in acute distress.    Appearance: She is well-developed.  HENT:     Head: Atraumatic.  Eyes:     Conjunctiva/sclera: Conjunctivae normal.  Cardiovascular:     Rate and Rhythm: Normal rate and regular rhythm.  Pulmonary:     Effort: Pulmonary effort is normal.  Abdominal:     Palpations: Abdomen is soft.     Tenderness: There is abdominal tenderness (epigastric tenderness, no guarding or rebound tenderness).  Musculoskeletal:     Cervical back: Neck supple.  Skin:    Findings: No rash.  Neurological:     Mental Status: She is alert.  Psychiatric:        Mood and Affect: Mood normal.     ED Results / Procedures / Treatments   Labs (all labs ordered are listed, but only abnormal results are displayed) Labs Reviewed  LIPASE, BLOOD - Abnormal; Notable for the following components:      Result Value   Lipase <10 (*)    All other components within normal limits  COMPREHENSIVE METABOLIC PANEL - Abnormal; Notable for the following components:   AST 10 (*)    Alkaline  Phosphatase 37 (*)    All other components within normal limits  CBC - Abnormal; Notable for the following components:   RBC 5.33 (*)    Hemoglobin 15.4 (*)    Platelets 124 (*)    All other components within normal limits  URINALYSIS, ROUTINE W REFLEX MICROSCOPIC - Abnormal; Notable for the following components:   APPearance HAZY (*)    Specific Gravity, Urine 1.032 (*)    Protein, ur 30 (*)    Leukocytes,Ua SMALL (*)    All other components within normal limits  PREGNANCY, URINE    EKG None  Radiology CT ABDOMEN PELVIS W CONTRAST  Result Date: 10/27/2021 CLINICAL DATA:  LUQ abdominal pain EXAM: CT ABDOMEN AND PELVIS WITH CONTRAST TECHNIQUE: Multidetector CT imaging of the abdomen and pelvis was  performed using the standard protocol following bolus administration of intravenous contrast. RADIATION DOSE REDUCTION: This exam was performed according to the departmental dose-optimization program which includes automated exposure control, adjustment of the mA and/or kV according to patient size and/or use of iterative reconstruction technique. CONTRAST:  OMNIPAQUE IOHEXOL 300 MG/ML  SOLN COMPARISON:  October 07, 2012 FINDINGS: Lower chest: No acute abnormality. Hepatobiliary: No focal liver abnormality is seen. No gallstones, gallbladder wall thickening, or biliary dilatation. Pancreas: Unremarkable. No pancreatic ductal dilatation or surrounding inflammatory changes. Spleen: Normal in size without focal abnormality. Adrenals/Urinary Tract: Adrenal glands are unremarkable. Kidneys are normal, without renal calculi, focal lesion, or hydronephrosis. Bladder is unremarkable. Stomach/Bowel: Stomach is within normal limits. Appendix appears normal. No evidence of bowel wall thickening, distention, or inflammatory changes. Vascular/Lymphatic: No significant vascular findings are present. No enlarged abdominal or pelvic lymph nodes. Reproductive: Uterus and bilateral adnexa are unremarkable except for a 3.4 x 2.2 x 3.1 cm cyst at the right adnexa. Other: No abdominal wall hernia or abnormality. No abdominopelvic ascites. Musculoskeletal: No acute or significant osseous findings. IMPRESSION: 3.4 x 2.2 x 3.1 cm simple appearing cyst in the right adnexa. 3.4 cm right adnexal simple-appearing cyst.No follow-up imaging is recommended.Reference: JACR 2020 Feb;17(2):248-254 Otherwise, enhanced CT of the abdomen and pelvis is unremarkable. Electronically Signed   By: Marjo Bicker M.D.   On: 10/27/2021 17:20   US Abdomen Limited  Result Date: 10/27/2021 CLINICAL DATA:  Epigastric pain.  Nausea and vomiting. EXAM: ULTRASOUND ABDOMEN LIMITED RIGHT UPPER QUADRANT COMPARISON:  CT 10/07/2012 FINDINGS: Gallbladder: No  gallstones or wall thickening visualized. No sonographic Murphy sign noted by sonographer. Common bile duct: Diameter: 2.4 mm.  Normal. Liver: No focal lesion identified. Within normal limits in parenchymal echogenicity. Portal vein is patent on color Doppler imaging with normal direction of blood flow towards the liver. Other: Question presence of a trace amount of fluid adjacent to the pancreas. This could indicate early pancreatitis. Consider abdominal CT if desired. IMPRESSION: Normal appearance of the hepatobiliary system. Question trace amount of fluid along the pancreas that could suggest early pancreatitis. Consider CT if concern persists. Electronically Signed   By: Paulina Fusi M.D.   On: 10/27/2021 16:19    Procedures Procedures    Medications Ordered in ED Medications  morphine (PF) 4 MG/ML injection 4 mg (4 mg Intravenous Patient Refused/Not Given 10/27/21 1700)  morphine (PF) 4 MG/ML injection 4 mg (4 mg Intravenous Given 10/27/21 1612)  ondansetron (ZOFRAN) injection 4 mg (4 mg Intravenous Given 10/27/21 1611)  alum & mag hydroxide-simeth (MAALOX/MYLANTA) 200-200-20 MG/5ML suspension 30 mL (30 mLs Oral Given 10/27/21 1614)  iohexol (OMNIPAQUE) 300 MG/ML solution 100 mL (100  mLs Intravenous Contrast Given 10/27/21 1657)    ED Course/ Medical Decision Making/ A&P                           Medical Decision Making Amount and/or Complexity of Data Reviewed Labs: ordered. Radiology: ordered.  Risk OTC drugs. Prescription drug management.   BP 129/81   Pulse 61   Temp 98.8 F (37.1 C)   Resp 16   Ht 5\' 7"  (1.702 m)   Wt 99.3 kg   LMP 09/19/2021   SpO2 100%   BMI 34.30 kg/m   3:12 PM This is a 26 year old female presenting with abd pain.  Report pain to epigastric region radiates to her back ongoing x 4 days.  Pain is sharp, stabbing, now with nausea and vomiting.  Report having appetite but afraid to eat.  Rates pain 9/10.  Report pain worse at night.  No fever, chills, cough,  sob, dysuria. No vaginal bleeding or vaginal discharge.    On exam pt has tenderness to epigastric region without guarding or rebound tenderness.  No Murphy sign.  No pain at McBurney's point.  Suspect gastritis/pud causing sxs.  However I also considered cholecystitis. Will give GI cocktail and will obtain limited abd 11/10 as part of the work up.    4:37 PM Labs and imaging independently reviewed and interpreted by me and I agree with radiologist interpretation.  Normal lipase, normal electrolyte panel, normal WBC, hemoglobin is mildly concentrated at 15.4.  Urinalysis without signs of urinary tract infection.  Pregnancy test is negative.  Limited abdominal ultrasound showing no evidence of pathology to the hepatobiliary system however there is trace fluid along the pancreas that could suggest early pancreatitis.    4:40 PM Patient admits that she does drink alcohol on a fairly regular basis.  At this time, will obtain abdominal pelvis CT scan for further assessment.  Patient did receive GI cocktail and 4 mg of morphine but reported no significant improvement of her symptoms.  Additional morphine given.  5:27 PM Abdominal pelvic CT scan obtained independently viewed interpreted by me and I agree with radiologist interpretation.  Unfortunately CT scan did not show any acute concerning finding.  Incidentally there are several simple appearing ovarian cyst noted in the right adnexal region.  This does not correlate with patient's presentation.  This patient presents to the ED for concern of abd pain, this involves an extensive number of treatment options, and is a complaint that carries with it a high risk of complications and morbidity.  The differential diagnosis includes cholecystitis, gastritis, GERD, pancreatitis, appendicitis, colitis, diverticulitis, UTI, dissection, acs  Co morbidities that complicate the patient evaluation none Additional history obtained:  Additional history obtained from  patient External records from outside source obtained and reviewed including EMR including prior labs and imaging  Lab Tests:  I Ordered, and personally interpreted labs.  The pertinent results include:  as above  Imaging Studies ordered:  I ordered imaging studies including abd/pelvis CT I independently visualized and interpreted imaging which showed no acute finding I agree with the radiologist interpretation  Cardiac Monitoring:  The patient was maintained on a cardiac monitor.  I personally viewed and interpreted the cardiac monitored which showed an underlying rhythm of: NSR  Medicines ordered and prescription drug management:  I ordered medication including morphine  for abd pain Reevaluation of the patient after these medicines showed that the patient improved I have reviewed the  patients home medicines and have made adjustments as needed  Test Considered: as above  Critical Interventions: IV opiate med  Antiemetic  GI cocktail   Problem List / ED Course: abd pain  Reevaluation:  After the interventions noted above, I reevaluated the patient and found that they have :improved  Social Determinants of Health: none  Dispostion:  After consideration of the diagnostic results and the patients response to treatment, I feel that the patent would benefit from outpt f/u.         Final Clinical Impression(s) / ED Diagnoses Final diagnoses:  Epigastric pain    Rx / DC Orders ED Discharge Orders          Ordered    pantoprazole (PROTONIX) 20 MG tablet  Daily        10/27/21 1737              Fayrene Helper, PA-C 10/27/21 1738    Blane Ohara, MD 10/29/21 2326

## 2021-10-27 NOTE — ED Provider Notes (Signed)
[facility]    CSN: [id number] Arrival date & time: 10/27/21  1000      History   Chief Complaint Chief Complaint  Patient presents with   Abdominal Pain   Back Pain    HPI Betty Richardson is a 26 y.o. female.   Patient presents with abdominal back and mid thoracic back pain that started about 3-4 days ago. Patient states it is a very sharp pain and is rated 10/10 on pain scale.  It is an intermittent pain.  Patient reports that pain starts in the epigastric area and radiates down to mid abdomen and through to back.  Patient also had some intermittent nausea, vomiting, diarrhea.  Denies any obvious blood in the stool.  Denies fever, body aches, chills.  Denies any known sick contacts.   Abdominal Pain Back Pain   Past Medical History:  Diagnosis Date   Medical history non-contributory     Patient Active Problem List   Diagnosis Date Noted   VBAC, delivered 11/08/2020   Placenta previa antepartum in third trimester 09/30/2019   S/P cesarean section 09/30/2019   Indication for care in labor or delivery 03/10/2018   SVD (spontaneous vaginal delivery) 03/10/2018    Past Surgical History:  Procedure Laterality Date   CESAREAN SECTION N/A 09/30/2019   Procedure: CESAREAN SECTION;  Surgeon: Edwinna Areola, DO;  Location: [facility] ORS;  Service: Obstetrics;  Laterality: N/A;  Heather, RNFA   NO PAST SURGERIES      OB History     Gravida  3   Para  3   Term  2   Preterm  0   AB  0   Living  2      SAB  0   IAB  0   Ectopic  0   Multiple  0   Live Births  2            Home Medications    Prior to Admission medications   Medication Sig Start Date End Date Taking? Authorizing Provider  etonogestrel (NEXPLANON) 68 MG IMPL implant 1 each by Subdermal route once.    [provider]  ibuprofen (ADVIL) 800 MG tablet Take 1 tablet (800 mg total) by mouth every 8 (eight) hours as needed. 11/09/20   Shivaji, Valerie Roys, MD   levocetirizine (XYZAL) 5 MG tablet Take 1 tablet (5 mg total) by mouth every evening. 07/12/21   Wallis Bamberg, PA-C  Prenatal Vit-Fe Fumarate-FA (PRENATAL MULTIVITAMIN) TABS tablet Take 1 tablet by mouth daily at 12 noon. 10/02/19   Bovard-Stuckert, Augusto Gamble, MD  pseudoephedrine (SUDAFED) 30 MG tablet Take 1 tablet (30 mg total) by mouth every 8 (eight) hours as needed for congestion. 07/12/21   Wallis Bamberg, PA-C    Family History Family History  Problem Relation Age of Onset   Heart failure Maternal Grandmother 63       type 2   Heart failure Maternal Grandfather 40       type2   Seizures Father     Social History Social History   Tobacco Use   Smoking status: Never   Smokeless tobacco: Never  Vaping Use   Vaping Use: Never used  Substance Use Topics   Alcohol use: No   Drug use: No     Allergies   Patient has no known allergies.   Review of Systems Review of Systems Per HPI  Physical Exam Triage Vital Signs ED Triage Vitals  Enc Vitals Group  BP 10/27/21 1053 130/80     Pulse Rate 10/27/21 1053 86     Resp 10/27/21 1053 18     Temp 10/27/21 1053 98.1 F (36.7 C)     Temp Source 10/27/21 1053 Oral     SpO2 10/27/21 1053 95 %     Weight --      Height --      Head Circumference --      Peak Flow --      Pain Score 10/27/21 1056 6     Pain Loc --      Pain Edu? --      Excl. in GC? --    No data found.  Updated Vital Signs BP 130/80 (BP Location: Left Arm)   Pulse 86   Temp 98.1 F (36.7 C) (Oral)   Resp 18   SpO2 95%   Visual Acuity Right Eye Distance:   Left Eye Distance:   Bilateral Distance:    Right Eye Near:   Left Eye Near:    Bilateral Near:     Physical Exam Constitutional:      General: She is not in acute distress.    Appearance: Normal appearance. She is not toxic-appearing or diaphoretic.  HENT:     Head: Normocephalic and atraumatic.  Eyes:     Extraocular Movements: Extraocular movements intact.     Conjunctiva/sclera:  Conjunctivae normal.  Cardiovascular:     Rate and Rhythm: Normal rate and regular rhythm.     Pulses: Normal pulses.     Heart sounds: Normal heart sounds.  Pulmonary:     Effort: Pulmonary effort is normal.     Breath sounds: Normal breath sounds.  Abdominal:     General: Abdomen is flat. Bowel sounds are normal. There is no distension.     Palpations: Abdomen is soft.     Tenderness: There is abdominal tenderness in the right upper quadrant and epigastric area.  Neurological:     General: No focal deficit present.     Mental Status: She is alert and oriented to person, place, and time. Mental status is at baseline.  Psychiatric:        Mood and Affect: Mood normal.        Behavior: Behavior normal.        Thought Content: Thought content normal.        Judgment: Judgment normal.      UC Treatments / Results  Labs (all labs ordered are listed, but only abnormal results are displayed) Labs Reviewed - No data to display  EKG   Radiology No results found.  Procedures Procedures (including critical care time)  Medications Ordered in UC Medications - No data to display  Initial Impression / Assessment and Plan / UC Course  I have reviewed the triage vital signs and the nursing notes.  Pertinent labs & imaging results that were available during my care of the patient were reviewed by me and considered in my medical decision making (see chart for details).     Patient appears very uncomfortable on exam table.  She is also complaining of severe 10/10 abdominal pain.  I do think this warrants further evaluation and management with possible advanced imaging which cannot be provided here in urgent care.  Patient was advised to go to the ER for further evaluation and management.  Patient was agreeable with plan.  Vital signs stable at discharge.  Agree with patient self transport to the hospital. Final Clinical Impressions(s) /  UC Diagnoses   Final diagnoses:  Abdominal  pain, epigastric     Discharge Instructions      Please go to the emergency department as soon as you leave urgent care for further evaluation and management.    ED Prescriptions   None    PDMP not reviewed this encounter.   Gustavus Bryant, [place] 10/27/21 1124

## 2021-10-27 NOTE — Discharge Instructions (Signed)
Please go to the emergency department as soon as you leave urgent care for further evaluation and management. ?

## 2022-03-03 ENCOUNTER — Ambulatory Visit
Admission: EM | Admit: 2022-03-03 | Discharge: 2022-03-03 | Disposition: A | Discharge status: Home / Self Care | Payer: Medicaid Other

## 2022-03-03 ENCOUNTER — Encounter: Payer: Self-pay | Admitting: Family Medicine

## 2022-03-03 ENCOUNTER — Ambulatory Visit (INDEPENDENT_AMBULATORY_CARE_PROVIDER_SITE_OTHER): Payer: Self-pay | Admitting: Family Medicine

## 2022-03-03 VITALS — BP 116/78 | HR 75 | Temp 98.4°F | Resp 16 | Wt 169.6 lb

## 2022-03-03 DIAGNOSIS — J020 Streptococcal pharyngitis: Secondary | ICD-10-CM

## 2022-03-03 DIAGNOSIS — J029 Acute pharyngitis, unspecified: Secondary | ICD-10-CM

## 2022-03-03 DIAGNOSIS — H6692 Otitis media, unspecified, left ear: Secondary | ICD-10-CM

## 2022-03-03 LAB — POCT RAPID STREP A (OFFICE): Rapid Strep A Screen: NEGATIVE

## 2022-03-03 MED ORDER — AMOXICILLIN 500 MG PO CAPS: 500.0000 mg | ORAL_CAPSULE | Freq: Three times a day (TID) | ORAL | 0 refills | Status: AC

## 2022-03-03 MED ORDER — FLUCONAZOLE 150 MG PO TABS: 150.0000 mg | ORAL_TABLET | Freq: Once | ORAL | 0 refills | Status: AC

## 2022-03-07 ENCOUNTER — Encounter: Payer: Self-pay | Admitting: Family Medicine

## 2022-03-07 NOTE — Progress Notes (Signed)
Established Patient Office Visit  Subjective    Patient ID: JUDITHANN VILLAMAR, female    DOB: 01/19/1996  Age: 26 y.o. MRN: [id number]  CC:  Chief Complaint  Patient presents with   Sore Throat    HPI JAMESON MORROW presents for complaint of ear pain and sore throat for about 1-2 days. Denies known trauma or injury. Denies fever/chills or viral sx. .    Outpatient Encounter Medications as of 03/03/2022  Medication Sig   amoxicillin (AMOXIL) 500 MG capsule Take 1 capsule (500 mg total) by mouth 3 (three) times daily for 10 days.   etonogestrel (NEXPLANON) 68 MG IMPL implant 1 each by Subdermal route once.   [EXPIRED] fluconazole (DIFLUCAN) 150 MG tablet Take 1 tablet (150 mg total) by mouth once for 1 dose.   ibuprofen (ADVIL) 800 MG tablet Take 1 tablet (800 mg total) by mouth every 8 (eight) hours as needed.   levocetirizine (XYZAL) 5 MG tablet Take 1 tablet (5 mg total) by mouth every evening.   pantoprazole (PROTONIX) 20 MG tablet Take 1 tablet (20 mg total) by mouth daily.   pseudoephedrine (SUDAFED) 30 MG tablet Take 1 tablet (30 mg total) by mouth every 8 (eight) hours as needed for congestion.   Prenatal Vit-Fe Fumarate-FA (PRENATAL MULTIVITAMIN) TABS tablet Take 1 tablet by mouth daily at 12 noon. (Patient not taking: Reported on 03/03/2022)   No facility-administered encounter medications on file as of 03/03/2022.    Past Medical History:  Diagnosis Date   Medical history non-contributory     Past Surgical History:  Procedure Laterality Date   CESAREAN SECTION N/A 09/30/2019   Procedure: CESAREAN SECTION;  Surgeon: Edwinna Areola, DO;  Location: [facility];  Service: Obstetrics;  Laterality: N/A;  Heather, RNFA   NO PAST SURGERIES      Family History  Problem Relation Age of Onset   Heart failure Maternal Grandmother 61       type 2   Heart failure Maternal Grandfather 85       type2   Seizures Father     Social History   Socioeconomic History    Marital status: Married    Spouse name: Not on file   Number of children: Not on file   Years of education: Not on file   Highest education level: Not on file  Occupational History   Not on file  Tobacco Use   Smoking status: Never   Smokeless tobacco: Never  Vaping Use   Vaping Use: Never used  Substance and Sexual Activity   Alcohol use: No   Drug use: No   Sexual activity: Yes    Birth control/protection: Implant  Other Topics Concern   Not on file  Social History Narrative   Not on file   Social Determinants of Health   Financial Resource Strain: Not on file  Food Insecurity: Not on file  Transportation Needs: Not on file  Physical Activity: Not on file  Stress: Not on file  Social Connections: Not on file  Intimate Partner Violence: Not on file    Review of Systems  Constitutional:  Negative for chills and fever.  HENT:  Positive for ear pain and sore throat.   All other systems reviewed and are negative.       Objective    BP 116/78   Pulse 75   Temp 98.4 F (36.9 C) (Oral)   Resp 16   Wt 169 lb 9.6 oz (76.9 kg)  SpO2 97%   BMI 26.56 kg/m   Physical Exam Vitals and nursing note reviewed.  Constitutional:      General: She is not in acute distress. HENT:     Left Ear: Drainage, swelling and tenderness present.     Mouth/Throat:     Pharynx: Oropharynx is clear. Posterior oropharyngeal erythema present.  Cardiovascular:     Rate and Rhythm: Normal rate and regular rhythm.  Pulmonary:     Effort: Pulmonary effort is normal.     Breath sounds: Normal breath sounds.  Neurological:     General: No focal deficit present.     Mental Status: She is alert and oriented to person, place, and time.         Assessment & Plan:   1. Left otitis media, unspecified otitis media type Amoxicillin and diflucan prescribed. Tylenol/nsaids prn  2. Acute pharyngitis, unspecified etiology As above - POCT rapid strep A    No follow-ups on file.    Tommie Raymond, MD

## 2022-03-08 ENCOUNTER — Ambulatory Visit (INDEPENDENT_AMBULATORY_CARE_PROVIDER_SITE_OTHER): Payer: Self-pay | Admitting: Family Medicine

## 2022-03-08 VITALS — BP 120/84 | HR 76 | Temp 98.8°F | Resp 16 | Wt 169.0 lb

## 2022-03-08 DIAGNOSIS — B349 Viral infection, unspecified: Secondary | ICD-10-CM

## 2022-03-08 NOTE — Progress Notes (Unsigned)
Patient is c/o loss of taste, fatigue, cough x several days w/ SOB.  My right ear is now starting to to become irritated as of yesterday (12/15) and I have lost my sense of taste as of (12/15).

## 2022-03-09 ENCOUNTER — Encounter: Payer: Self-pay | Admitting: Family Medicine

## 2022-03-09 LAB — COVID-19, FLU A+B AND RSV
Influenza A, NAA: NOT DETECTED
Influenza B, NAA: NOT DETECTED
RSV, NAA: NOT DETECTED
SARS-CoV-2, NAA: DETECTED — AB

## 2022-03-09 NOTE — Progress Notes (Signed)
Established Patient Office Visit  Subjective    Patient ID: Betty Richardson, female    DOB: Jan 12, 1996  Age: 26 y.o. MRN: [id number]  CC:  Chief Complaint  Patient presents with   Sore Throat   Cough    HPI Betty Richardson presents to establish care   Outpatient Encounter Medications as of 03/08/2022  Medication Sig   amoxicillin (AMOXIL) 500 MG capsule Take 1 capsule (500 mg total) by mouth 3 (three) times daily for 10 days.   etonogestrel (NEXPLANON) 68 MG IMPL implant 1 each by Subdermal route once.   ibuprofen (ADVIL) 800 MG tablet Take 1 tablet (800 mg total) by mouth every 8 (eight) hours as needed.   levocetirizine (XYZAL) 5 MG tablet Take 1 tablet (5 mg total) by mouth every evening.   pantoprazole (PROTONIX) 20 MG tablet Take 1 tablet (20 mg total) by mouth daily.   Prenatal Vit-Fe Fumarate-FA (PRENATAL MULTIVITAMIN) TABS tablet Take 1 tablet by mouth daily at 12 noon.   pseudoephedrine (SUDAFED) 30 MG tablet Take 1 tablet (30 mg total) by mouth every 8 (eight) hours as needed for congestion.   No facility-administered encounter medications on file as of 03/08/2022.    Past Medical History:  Diagnosis Date   Medical history non-contributory     Past Surgical History:  Procedure Laterality Date   CESAREAN SECTION N/A 09/30/2019   Procedure: CESAREAN SECTION;  Surgeon: Edwinna Areola, DO;  Location: [facility];  Service: Obstetrics;  Laterality: N/A;  Heather, RNFA   NO PAST SURGERIES      Family History  Problem Relation Age of Onset   Heart failure Maternal Grandmother 68       type 2   Heart failure Maternal Grandfather 17       type2   Seizures Father     Social History   Socioeconomic History   Marital status: Married    Spouse name: Not on file   Number of children: Not on file   Years of education: Not on file   Highest education level: Not on file  Occupational History   Not on file  Tobacco Use   Smoking status: Never    Smokeless tobacco: Never  Vaping Use   Vaping Use: Never used  Substance and Sexual Activity   Alcohol use: No   Drug use: No   Sexual activity: Yes    Birth control/protection: Implant  Other Topics Concern   Not on file  Social History Narrative   Not on file   Social Determinants of Health   Financial Resource Strain: Not on file  Food Insecurity: Not on file  Transportation Needs: Not on file  Physical Activity: Not on file  Stress: Not on file  Social Connections: Not on file  Intimate Partner Violence: Not on file    Review of Systems  Constitutional:  Positive for chills, fever and malaise/fatigue.  HENT:  Negative for sore throat.   Respiratory:  Positive for cough and shortness of breath.   All other systems reviewed and are negative.       Objective    BP 120/84   Pulse 76   Temp 98.8 F (37.1 C) (Oral)   Resp 16   Wt 169 lb (76.7 kg)   SpO2 97%   BMI 26.47 kg/m   Physical Exam Vitals and nursing note reviewed.  Constitutional:      General: She is not in acute distress.    Appearance: She  is ill-appearing.  HENT:     Right Ear: Tympanic membrane normal.     Left Ear: Tympanic membrane normal.     Mouth/Throat:     Mouth: Mucous membranes are moist.     Pharynx: Oropharynx is clear.  Cardiovascular:     Rate and Rhythm: Normal rate and regular rhythm.  Pulmonary:     Effort: Pulmonary effort is normal.     Breath sounds: Normal breath sounds.  Abdominal:     Palpations: Abdomen is soft.     Tenderness: There is no abdominal tenderness.  Musculoskeletal:     Cervical back: Normal range of motion and neck supple.  Neurological:     General: No focal deficit present.     Mental Status: She is alert and oriented to person, place, and time.         Assessment & Plan:   1. Viral syndrome Results pending. Conservative management recommended.  - COVID-19, Flu A+B and RSV    Return if symptoms worsen or fail to improve.   Tommie Raymond, MD

## 2022-12-01 ENCOUNTER — Ambulatory Visit (HOSPITAL_COMMUNITY)
Admission: EM | Admit: 2022-12-01 | Discharge: 2022-12-01 | Disposition: A | Discharge status: Home / Self Care | Payer: Medicaid Other | Attending: Internal Medicine | Admitting: Internal Medicine

## 2022-12-01 ENCOUNTER — Encounter (HOSPITAL_COMMUNITY): Payer: Self-pay

## 2022-12-01 DIAGNOSIS — B349 Viral infection, unspecified: Secondary | ICD-10-CM | POA: Diagnosis not present

## 2022-12-01 DIAGNOSIS — Z20822 Contact with and (suspected) exposure to covid-19: Secondary | ICD-10-CM | POA: Diagnosis not present

## 2022-12-01 LAB — POCT RAPID STREP A (OFFICE): Rapid Strep A Screen: NEGATIVE

## 2022-12-01 MED ORDER — GUAIFENESIN ER 600 MG PO TB12: 600.0000 mg | ORAL_TABLET | Freq: Two times a day (BID) | ORAL | 0 refills | Status: AC

## 2022-12-01 MED ORDER — CETIRIZINE HCL 10 MG PO TABS: 10.0000 mg | ORAL_TABLET | Freq: Every day | ORAL | 2 refills | Status: AC

## 2022-12-01 MED ORDER — FLUTICASONE PROPIONATE 50 MCG/ACT NA SUSP: 2.0000 | Freq: Every day | NASAL | 2 refills | Status: AC

## 2022-12-01 NOTE — ED Triage Notes (Signed)
Pt reports sore throat,nasal congestion and cough x 2 days. Pt has history of strep. Pt has been expose to covid.

## 2022-12-01 NOTE — Discharge Instructions (Addendum)
I recommend mucinex twice daily Also try daily allergy medicine like zyrtec, and nasal spray such as flonase You can take ibuprofen and/or tylenol every 6 hours for pain/fever/aches Make sure you are drinking lots of fluids!  The strep test today was negative. We will call you if anything returns on your throat culture (2-3 days) or if covid test is positive (~24 hours)

## 2022-12-01 NOTE — ED Provider Notes (Signed)
[facility]    CSN: [id number] Arrival date & time: 12/01/22  7829     History   Chief Complaint Chief Complaint  Patient presents with   Cough    HPI Betty Richardson is a 27 y.o. female.  2-day history of nasal congestion, slight cough Had sore throat last night that resolved with ibuprofen  No known fever Denies abdominal pain, nausea vomiting, rash  Reports history of strep Also reports close COVID exposure at work x4  Past Medical History:  Diagnosis Date   Medical history non-contributory     Patient Active Problem List   Diagnosis Date Noted   VBAC, delivered 11/08/2020   Placenta previa antepartum in third trimester 09/30/2019   S/P cesarean section 09/30/2019   Indication for care in labor or delivery 03/10/2018   SVD (spontaneous vaginal delivery) 03/10/2018    Past Surgical History:  Procedure Laterality Date   CESAREAN SECTION N/A 09/30/2019   Procedure: CESAREAN SECTION;  Surgeon: Edwinna Areola, DO;  Location: [facility];  Service: Obstetrics;  Laterality: N/A;  Heather, RNFA   NO PAST SURGERIES      OB History     Gravida  3   Para  3   Term  2   Preterm  0   AB  0   Living  2      SAB  0   IAB  0   Ectopic  0   Multiple  0   Live Births  2            Home Medications    Prior to Admission medications   Medication Sig Start Date End Date Taking? Authorizing Provider  cetirizine (ZYRTEC ALLERGY) 10 MG tablet Take 1 tablet (10 mg total) by mouth daily. 12/01/22  Yes Lauriel Helin, Lurena Joiner, PA-C  etonogestrel (NEXPLANON) 68 MG IMPL implant 1 each by Subdermal route once.   Yes [provider]  fluticasone (FLONASE) 50 MCG/ACT nasal spray Place 2 sprays into both nostrils daily. 12/01/22  Yes Ashliegh Parekh, Lurena Joiner, PA-C  guaiFENesin (MUCINEX) 600 MG 12 hr tablet Take 1 tablet (600 mg total) by mouth 2 (two) times daily for 5 days. 12/01/22 12/06/22 Yes Demetruis Depaul, Lurena Joiner, PA-C  ibuprofen (ADVIL) 800 MG tablet Take  1 tablet (800 mg total) by mouth every 8 (eight) hours as needed. 11/09/20  Yes Shivaji, Valerie Roys, MD  pantoprazole (PROTONIX) 20 MG tablet Take 1 tablet (20 mg total) by mouth daily. 10/27/21  Yes Fayrene Helper, PA-C  Prenatal Vit-Fe Fumarate-FA (PRENATAL MULTIVITAMIN) TABS tablet Take 1 tablet by mouth daily at 12 noon. 10/02/19  Yes Bovard-Stuckert, Jody, MD    Family History Family History  Problem Relation Age of Onset   Heart failure Maternal Grandmother 49       type 2   Heart failure Maternal Grandfather 48       type2   Seizures Father     Social History Social History   Tobacco Use   Smoking status: Never   Smokeless tobacco: Never  Vaping Use   Vaping status: Never Used  Substance Use Topics   Alcohol use: No   Drug use: No     Allergies   Patient has no known allergies.   Review of Systems Review of Systems Per HPI  Physical Exam Triage Vital Signs ED Triage Vitals [12/01/22 0943]  Encounter Vitals Group     BP 120/75     Systolic BP Percentile  Diastolic BP Percentile      Pulse Rate 90     Resp 16     Temp 98.8 F (37.1 C)     Temp Source Oral     SpO2 97 %     Weight      Height      Head Circumference      Peak Flow      Pain Score      Pain Loc      Pain Education      Exclude from Growth Chart    No data found.  Updated Vital Signs BP 120/75 (BP Location: Left Arm)   Pulse 90   Temp 98.8 F (37.1 C) (Oral)   Resp 16   SpO2 97%    Physical Exam Vitals and nursing note reviewed.  Constitutional:      General: She is not in acute distress. HENT:     Right Ear: Tympanic membrane and ear canal normal.     Left Ear: Tympanic membrane and ear canal normal.     Nose: Congestion present. No rhinorrhea.     Mouth/Throat:     Mouth: Mucous membranes are moist.     Pharynx: Oropharynx is clear. Posterior oropharyngeal erythema (mild) present.  Eyes:     Conjunctiva/sclera: Conjunctivae normal.  Cardiovascular:     Rate and  Rhythm: Normal rate and regular rhythm.     Heart sounds: Normal heart sounds.  Pulmonary:     Effort: Pulmonary effort is normal.     Breath sounds: Normal breath sounds.  Abdominal:     Palpations: Abdomen is soft.     Tenderness: There is no abdominal tenderness.  Musculoskeletal:     Cervical back: Normal range of motion.  Lymphadenopathy:     Cervical: No cervical adenopathy.  Skin:    General: Skin is warm and dry.  Neurological:     Mental Status: She is alert and oriented to person, place, and time.     UC Treatments / Results  Labs (all labs ordered are listed, but only abnormal results are displayed) Labs Reviewed  CULTURE, GROUP A STREP ([facility])  SARS CORONAVIRUS 2 (TAT 6-24 HRS)  POCT RAPID STREP A (OFFICE)   EKG  Radiology No results found.  Procedures Procedures  Medications Ordered in UC Medications - No data to display  Initial Impression / Assessment and Plan / UC Course  I have reviewed the triage vital signs and the nursing notes.  Pertinent labs & imaging results that were available during my care of the patient were reviewed by me and considered in my medical decision making (see chart for details).  Afebrile in clinic, overall well appearing Rapid strep negative, culture pending COVID test pending Symptomatic care at home, recommend mucinex, allergy med, nasal spray, increase fluids Discussed usual viral course, return precautions. Patient agreeable to plan. Work note provided   Final Clinical Impressions(s) / UC Diagnoses   Final diagnoses:  Viral illness  Close exposure to COVID-19 virus     Discharge Instructions      I recommend mucinex twice daily Also try daily allergy medicine like zyrtec, and nasal spray such as flonase You can take ibuprofen and/or tylenol every 6 hours for pain/fever/aches Make sure you are drinking lots of fluids!  The strep test today was negative. We will call you if anything returns on your throat  culture (2-3 days) or if covid test is positive (~24 hours)     ED Prescriptions  Medication Sig Dispense Auth. Provider   guaiFENesin (MUCINEX) 600 MG 12 hr tablet Take 1 tablet (600 mg total) by mouth 2 (two) times daily for 5 days. 10 tablet Luvena Wentling, PA-C   fluticasone (FLONASE) 50 MCG/ACT nasal spray Place 2 sprays into both nostrils daily. 9.9 mL Rufus Cypert, PA-C   cetirizine (ZYRTEC ALLERGY) 10 MG tablet Take 1 tablet (10 mg total) by mouth daily. 30 tablet Mandy Fitzwater, Lurena Joiner, PA-C      PDMP not reviewed this encounter.   Mihailo Sage, Ray Church 12/01/22 1131

## 2022-12-02 LAB — SARS CORONAVIRUS 2 (TAT 6-24 HRS): SARS Coronavirus 2: NEGATIVE

## 2022-12-03 LAB — CULTURE, GROUP A STREP (THRC)

## 2024-01-23 DIAGNOSIS — Z1151 Encounter for screening for human papillomavirus (HPV): Secondary | ICD-10-CM | POA: Diagnosis not present

## 2024-01-23 DIAGNOSIS — Z124 Encounter for screening for malignant neoplasm of cervix: Secondary | ICD-10-CM | POA: Diagnosis not present

## 2024-01-23 DIAGNOSIS — Z1389 Encounter for screening for other disorder: Secondary | ICD-10-CM | POA: Diagnosis not present

## 2024-01-23 DIAGNOSIS — Z01419 Encounter for gynecological examination (general) (routine) without abnormal findings: Secondary | ICD-10-CM | POA: Diagnosis not present

## 2024-01-23 DIAGNOSIS — Z3046 Encounter for surveillance of implantable subdermal contraceptive: Secondary | ICD-10-CM | POA: Diagnosis not present

## 2024-01-23 DIAGNOSIS — Z130 Encounter for screening for diseases of the blood and blood-forming organs and certain disorders involving the immune mechanism: Secondary | ICD-10-CM | POA: Diagnosis not present

## 2024-02-22 ENCOUNTER — Other Ambulatory Visit: Payer: Self-pay

## 2024-02-22 ENCOUNTER — Emergency Department (HOSPITAL_BASED_OUTPATIENT_CLINIC_OR_DEPARTMENT_OTHER): Admitting: Radiology

## 2024-02-22 ENCOUNTER — Emergency Department (HOSPITAL_BASED_OUTPATIENT_CLINIC_OR_DEPARTMENT_OTHER)
Admission: EM | Admit: 2024-02-22 | Discharge: 2024-02-22 | Disposition: A | Discharge status: Home / Self Care | Attending: Emergency Medicine | Admitting: Emergency Medicine

## 2024-02-22 DIAGNOSIS — R0789 Other chest pain: Secondary | ICD-10-CM | POA: Insufficient documentation

## 2024-02-22 DIAGNOSIS — R7989 Other specified abnormal findings of blood chemistry: Secondary | ICD-10-CM | POA: Diagnosis not present

## 2024-02-22 DIAGNOSIS — R1013 Epigastric pain: Secondary | ICD-10-CM | POA: Insufficient documentation

## 2024-02-22 DIAGNOSIS — R079 Chest pain, unspecified: Secondary | ICD-10-CM | POA: Diagnosis not present

## 2024-02-22 LAB — BASIC METABOLIC PANEL WITH GFR
Anion gap: 12 (ref 5–15)
BUN: 14 mg/dL (ref 6–20)
CO2: 26 mmol/L (ref 22–32)
Calcium: 9.9 mg/dL (ref 8.9–10.3)
Chloride: 100 mmol/L (ref 98–111)
Creatinine, Ser: 1.18 mg/dL — ABNORMAL HIGH (ref 0.44–1.00)
GFR, Estimated: >60 mL/min (ref >60)
Glucose, Bld: 97 mg/dL (ref 70–99)
Potassium: 3.7 mmol/L (ref 3.5–5.1)
Sodium: 138 mmol/L (ref 135–145)

## 2024-02-22 LAB — HEPATIC FUNCTION PANEL
ALT: 10 U/L (ref 0–44)
AST: 15 U/L (ref 15–41)
Albumin: 4.1 g/dL (ref 3.5–5.0)
Alkaline Phosphatase: 39 U/L (ref 38–126)
Bilirubin, Direct: 0.2 mg/dL (ref 0.0–0.2)
Indirect Bilirubin: 0.3 mg/dL (ref 0.3–0.9)
Total Bilirubin: 0.4 mg/dL (ref 0.0–1.2)
Total Protein: 6.3 g/dL — ABNORMAL LOW (ref 6.5–8.1)

## 2024-02-22 LAB — CBC
HCT: 42.4 % (ref 36.0–46.0)
Hemoglobin: 14.4 g/dL (ref 12.0–15.0)
MCH: 28.6 pg (ref 26.0–34.0)
MCHC: 34 g/dL (ref 30.0–36.0)
MCV: 84.3 fL (ref 80.0–100.0)
Platelets: 134 K/uL — ABNORMAL LOW (ref 150–400)
RBC: 5.03 MIL/uL (ref 3.87–5.11)
RDW: 13.2 % (ref 11.5–15.5)
WBC: 6.4 K/uL (ref 4.0–10.5)
nRBC: 0 % (ref 0.0–0.2)

## 2024-02-22 LAB — TROPONIN T, HIGH SENSITIVITY: Troponin T High Sensitivity: <15 ng/L (ref 0–19)

## 2024-02-22 LAB — LIPASE, BLOOD: Lipase: 24 U/L (ref 11–51)

## 2024-02-22 LAB — D-DIMER, QUANTITATIVE: D-Dimer, Quant: <0.27 ug{FEU}/mL (ref 0.00–0.50)

## 2024-02-22 MED ORDER — PANTOPRAZOLE SODIUM 40 MG IV SOLR
40.0000 mg | Freq: Once | INTRAVENOUS | Status: AC
  Administered 2024-02-22: 40 mg via INTRAVENOUS
  Filled 2024-02-22: qty 10

## 2024-02-22 MED ORDER — LACTATED RINGERS IV BOLUS
1000.0000 mL | Freq: Once | INTRAVENOUS | Status: AC
  Administered 2024-02-22: 1000 mL via INTRAVENOUS

## 2024-02-22 MED ORDER — ACETAMINOPHEN 500 MG PO TABS
1000.0000 mg | ORAL_TABLET | Freq: Once | ORAL | Status: AC
  Administered 2024-02-22: 1000 mg via ORAL
  Filled 2024-02-22: qty 2

## 2024-02-22 NOTE — ED Provider Notes (Signed)
 [facility] AT [facility] Provider Note   CSN: [id number] Arrival date & time: 02/22/24  1618     Patient presents with: Chest Pain   Betty Richardson is a 28 y.o. female with no significant past medical history presents with concern for epigastric and central chest pain that has been on and off for the past week.  She reports this started after drinking alcohol this weekend.  The pain is nonexertional, nonpleuritic.  She does report some pain that goes to her mid back.  She denies any nausea or vomiting.  Denies any fever or chills.  Reports she is on Nexplanon .  She denies any recent long plane or car rides, recent hospitalizations or surgeries, or any history of blood clot.    Chest Pain      Prior to Admission medications   Medication Sig Start Date End Date Taking? Authorizing Provider  cetirizine  (ZYRTEC  ALLERGY) 10 MG tablet Take 1 tablet (10 mg total) by mouth daily. 12/01/22   Rising, Asberry, PA-C  etonogestrel  (NEXPLANON ) 68 MG IMPL implant 1 each by Subdermal route once.    [provider]  fluticasone  (FLONASE ) 50 MCG/ACT nasal spray Place 2 sprays into both nostrils daily. 12/01/22   Rising, Asberry, PA-C  ibuprofen  (ADVIL ) 800 MG tablet Take 1 tablet (800 mg total) by mouth every 8 (eight) hours as needed. 11/09/20   Shivaji, Lavonia CHRISTELLA, MD  pantoprazole  (PROTONIX ) 20 MG tablet Take 1 tablet (20 mg total) by mouth daily. 10/27/21   Nivia Colon, PA-C  Prenatal Vit-Fe Fumarate-FA (PRENATAL MULTIVITAMIN) TABS tablet Take 1 tablet by mouth daily at 12 noon. 10/02/19   Bovard-Stuckert, Jody, MD    Allergies: Patient has no known allergies.    Review of Systems  Cardiovascular:  Positive for chest pain.    Updated Vital Signs BP 134/89   Pulse 85   Temp 98.1 F (36.7 C) (Oral)   Resp 18   SpO2 99%   Physical Exam Vitals and nursing note reviewed.  Constitutional:      General: She is not in acute distress.    Appearance: She is  well-developed.  HENT:     Head: Normocephalic and atraumatic.  Eyes:     Conjunctiva/sclera: Conjunctivae normal.  Cardiovascular:     Rate and Rhythm: Normal rate and regular rhythm.     Heart sounds: No murmur heard. Pulmonary:     Effort: Pulmonary effort is normal. No respiratory distress.     Breath sounds: Normal breath sounds.  Abdominal:     Palpations: Abdomen is soft.     Tenderness: There is no abdominal tenderness.  Musculoskeletal:        General: No swelling.     Cervical back: Neck supple.     Right lower leg: No edema.     Left lower leg: No edema.     Comments: No calf edema or tenderness to palpation bilaterally  Skin:    General: Skin is warm and dry.     Capillary Refill: Capillary refill takes less than 2 seconds.  Neurological:     Mental Status: She is alert.  Psychiatric:        Mood and Affect: Mood normal.     (all labs ordered are listed, but only abnormal results are displayed) Labs Reviewed  BASIC METABOLIC PANEL WITH GFR - Abnormal; Notable for the following components:      Result Value   Creatinine, Ser 1.18 (*)    All  other components within normal limits  CBC - Abnormal; Notable for the following components:   Platelets 134 (*)    All other components within normal limits  HEPATIC FUNCTION PANEL - Abnormal; Notable for the following components:   Total Protein 6.3 (*)    All other components within normal limits  LIPASE, BLOOD  D-DIMER, QUANTITATIVE (NOT AT [facility])  PREGNANCY, URINE  TROPONIN T, HIGH SENSITIVITY    EKG: None  Radiology: DG Chest 2 View Result Date: 02/22/2024 CLINICAL DATA:  Chest pain. EXAM: CHEST - 2 VIEW COMPARISON:  None Available. FINDINGS: The heart size and mediastinal contours are within normal limits. Both lungs are clear. The visualized skeletal structures are unremarkable. IMPRESSION: No active cardiopulmonary disease. Electronically Signed   By: Suzen Dials M.D.   On: 02/22/2024 18:24      Procedures   Medications Ordered in the ED  lactated ringers  bolus 1,000 mL (0 mLs Intravenous Stopped 02/22/24 2155)  pantoprazole  (PROTONIX ) injection 40 mg (40 mg Intravenous Given 02/22/24 2007)  acetaminophen  (TYLENOL ) tablet 1,000 mg (1,000 mg Oral Given 02/22/24 2007)    Clinical Course as of 02/22/24 2201  Thu Feb 22, 2024  2152 Upon recheck, patient without any pain.  Able to tolerate p.o. intake. [AF]    Clinical Course User Index [AF] Veta Palma, PA-C                                 Medical Decision Making Amount and/or Complexity of Data Reviewed Labs: ordered. Radiology: ordered.  Risk OTC drugs. Prescription drug management.     Differential diagnosis includes but is not limited to pancreatitis, ACS, arrhythmia, aortic aneurysm, pericarditis, myocarditis, pericardial effusion, cardiac tamponade, musculoskeletal pain, GERD, Boerhaave's syndrome, DVT/PE, pneumonia, pleural effusion   ED Course:  Upon initial evaluation, patient is well-appearing, no acute distress.  Normal vital signs.  Abdomen is soft and nontender.  Regular rate and rhythm on cardiac auscultation.  Lungs clear to auscultation bilaterally.   Labs Ordered: I Ordered, and personally interpreted labs.  The pertinent results include:   CBC with slightly low platelets at 134.  No leukocytosis or other abnormality BMP with elevated creatinine 1.18.  No other electrolyte abnormalities. Hepatic function panel within normal limits Lipase within normal limits Troponin within normal limits D-dimer within normal limits  Imaging Studies ordered: I ordered imaging studies including chest x-ray I independently visualized the imaging with scope of interpretation limited to determining acute life threatening conditions related to emergency care. Imaging showed no acute abnormalities I agree with the radiologist interpretation   Cardiac Monitoring: / EKG: The patient was maintained on a  cardiac monitor.  I personally viewed and interpreted the cardiac monitored which showed an underlying rhythm of: Normal sinus rhythm, no ST changes    Medications Given: LR bolus Protonix  Tylenol   Upon re-evaluation, patient remains well-appearing with stable vitals.  Reports pain has resolved with the fluids, Protonix , and Tylenol  given.   Low concern for ACS at this time given troponin within normal limits, pain non-exertional, and EKG with normal sinus rhythm and no ST changes.  Chest x-ray without any acute abnormality. No concern for DVT or PE at this time given D-dimer within normal limits Patient's creatinine is slightly at 1.18, she was given fluids as this is suspected due to dehydration Patient's hepatic function panel and lipase are within normal limits.  However, given the location of pain being epigastric, and  pain starting after alcohol intake, I question if she may have had a pancreatitis flare that is now resolving.  Also could consider GERD.  Given her resolved pain, ability to tolerate PO, stable vitals, and reassuring labs, I feel she is stable and appropriate for discharge home.    Impression: Epigastric pain/atypical chest pain  Disposition:  The patient was discharged home with instructions to keep well-hydrated with water .  Tylenol  and ibuprofen  as needed for pain. Limit/discontinue alcohol use. Follow-up with PCP if pain does not seem to be improving. Return precautions given and patient verbalized understanding.     This chart was dictated using voice recognition software, Dragon. Despite the best efforts of this provider to proofread and correct errors, errors may still occur which can change documentation meaning.       Final diagnoses:  Atypical chest pain  Epigastric pain    ED Discharge Orders     None          Veta Palma, PA-C 02/22/24 2201    Ellouise Fine K, DO 02/22/24 2337

## 2024-02-22 NOTE — Discharge Instructions (Addendum)
 Your workup today is reassuring. It is very unlikely that your pain is due to an issue in your heart.  Your cardiac enzyme (troponin) was normal today. Your EKG which is a measure of the heart's electrical activity and rhythm is normal today. These would both show abnormalities if you were having a heart attack.  No signs of a blood clot in your lungs  Your chest x-ray is normal today.  Your kidney lab (creatinine) was slightly abnormal today and this is likely due to being dehydrated.  You were given fluids here today to help with this.  Please increase your water  intake at home to keep your urine a light yellow color.  Your liver, gallbladder, and pancreas labs are normal today.  However, given the location of your pain and the onset of pain in relation to alcohol use, I suspect you may have had a pancreatitis flare that is now resolving.  Please avoid alcohol use to prevent further flareups. This could also be pain related to acid reflux. Please follow up with your PCP if you have persistent symptoms   You may take up to 1000mg  of tylenol  every 6 hours as needed for pain. Do not take more then 4g per day.   You may use up to 600mg  ibuprofen  every 6 hours as needed for pain.  Do not exceed 2.4g of ibuprofen  per day.   Return to the ER if you have any shortness of breath, worsening chest pain, persistent vomiting, fevers, any other new or concerning symptoms

## 2024-02-22 NOTE — ED Notes (Signed)
Patient verbalizes understanding of discharge instructions. Opportunity for questioning and answers were provided. Armband removed by staff, pt discharged from ED. Ambulated out to lobby with family ? ?

## 2024-02-22 NOTE — ED Triage Notes (Signed)
 Report central CP x 1 week. Denies SHOB.

## 2024-04-02 ENCOUNTER — Ambulatory Visit: Admitting: Family Medicine
